# Patient Record
Sex: Male | Born: 1970 | Race: White | Hispanic: No | Smoking: Never smoker
Health system: Southern US, Community
[De-identification: ages and names within clinical notes are randomized; demographics above are authoritative.]

## PROBLEM LIST (undated history)

## (undated) DIAGNOSIS — L0591 Pilonidal cyst without abscess: Secondary | ICD-10-CM

## (undated) DIAGNOSIS — F419 Anxiety disorder, unspecified: Secondary | ICD-10-CM

## (undated) DIAGNOSIS — G709 Myoneural disorder, unspecified: Secondary | ICD-10-CM

## (undated) DIAGNOSIS — M5430 Sciatica, unspecified side: Secondary | ICD-10-CM

## (undated) DIAGNOSIS — K219 Gastro-esophageal reflux disease without esophagitis: Secondary | ICD-10-CM

## (undated) DIAGNOSIS — I10 Essential (primary) hypertension: Secondary | ICD-10-CM

## (undated) HISTORY — PX: WISDOM TOOTH EXTRACTION: SHX21

## (undated) HISTORY — DX: Myoneural disorder, unspecified: G70.9

## (undated) HISTORY — DX: Gastro-esophageal reflux disease without esophagitis: K21.9

---

## 2002-04-24 DIAGNOSIS — I10 Essential (primary) hypertension: Secondary | ICD-10-CM

## 2002-04-24 HISTORY — DX: Essential (primary) hypertension: I10

## 2004-04-24 DIAGNOSIS — K219 Gastro-esophageal reflux disease without esophagitis: Secondary | ICD-10-CM

## 2004-04-24 HISTORY — DX: Gastro-esophageal reflux disease without esophagitis: K21.9

## 2013-06-24 ENCOUNTER — Ambulatory Visit: Payer: Self-pay | Admitting: Emergency Medicine

## 2016-11-17 ENCOUNTER — Ambulatory Visit: Payer: Self-pay | Admitting: *Deleted

## 2016-11-17 VITALS — BP 132/80 | HR 73 | Ht 73.0 in | Wt 268.0 lb

## 2016-11-17 DIAGNOSIS — Z0000 Encounter for general adult medical examination without abnormal findings: Secondary | ICD-10-CM

## 2016-11-17 NOTE — Progress Notes (Signed)
[employer] insurance premium discount evaluation: Labs Drawn. Replacements ROI form signed. Tobacco Free Attestation form signed.  Forms placed in paper chart.  Ok to route results to pcp per pt. 

## 2016-11-18 LAB — CMP12+LP+TP+TSH+6AC+PSA+CBC…
A/G RATIO: 1.6 (ref 1.2–2.2)
ALK PHOS: 62 IU/L (ref 39–117)
ALT: 20 IU/L (ref 0–44)
AST: 16 IU/L (ref 0–40)
Albumin: 4.6 g/dL (ref 3.5–5.5)
BASOS: 1 %
BILIRUBIN TOTAL: 0.6 mg/dL (ref 0.0–1.2)
BUN/Creatinine Ratio: 16 (ref 9–20)
BUN: 16 mg/dL (ref 6–24)
Basophils Absolute: 0 10*3/uL (ref 0.0–0.2)
CHLORIDE: 104 mmol/L (ref 96–106)
CHOLESTEROL TOTAL: 158 mg/dL (ref 100–199)
Calcium: 9.7 mg/dL (ref 8.7–10.2)
Chol/HDL Ratio: 5.3 ratio — ABNORMAL HIGH (ref 0.0–5.0)
Creatinine, Ser: 0.99 mg/dL (ref 0.76–1.27)
EOS (ABSOLUTE): 0.2 10*3/uL (ref 0.0–0.4)
Eos: 3 %
Estimated CHD Risk: 1.1 times avg. — ABNORMAL HIGH (ref 0.0–1.0)
FREE THYROXINE INDEX: 2.2 (ref 1.2–4.9)
GFR calc Af Amer: 106 mL/min/{1.73_m2} (ref >59)
GFR calc non Af Amer: 92 mL/min/{1.73_m2} (ref >59)
GGT: 23 IU/L (ref 0–65)
GLUCOSE: 90 mg/dL (ref 65–99)
Globulin, Total: 2.9 g/dL (ref 1.5–4.5)
HDL: 30 mg/dL — ABNORMAL LOW (ref >39)
HEMATOCRIT: 43.7 % (ref 37.5–51.0)
Hemoglobin: 14.9 g/dL (ref 13.0–17.7)
IMMATURE GRANULOCYTES: 0 %
IRON: 116 ug/dL (ref 38–169)
Immature Grans (Abs): 0 10*3/uL (ref 0.0–0.1)
LDH: 146 IU/L (ref 121–224)
LDL Calculated: 104 mg/dL — ABNORMAL HIGH (ref 0–99)
LYMPHS ABS: 2.3 10*3/uL (ref 0.7–3.1)
Lymphs: 40 %
MCH: 29.2 pg (ref 26.6–33.0)
MCHC: 34.1 g/dL (ref 31.5–35.7)
MCV: 86 fL (ref 79–97)
MONOS ABS: 0.3 10*3/uL (ref 0.1–0.9)
Monocytes: 6 %
NEUTROS PCT: 50 %
Neutrophils Absolute: 3 10*3/uL (ref 1.4–7.0)
PLATELETS: 223 10*3/uL (ref 150–379)
PROSTATE SPECIFIC AG, SERUM: 1 ng/mL (ref 0.0–4.0)
Phosphorus: 3.2 mg/dL (ref 2.5–4.5)
Potassium: 5 mmol/L (ref 3.5–5.2)
RBC: 5.11 x10E6/uL (ref 4.14–5.80)
RDW: 13.4 % (ref 12.3–15.4)
SODIUM: 140 mmol/L (ref 134–144)
T3 UPTAKE RATIO: 27 % (ref 24–39)
T4 TOTAL: 8.2 ug/dL (ref 4.5–12.0)
TOTAL PROTEIN: 7.5 g/dL (ref 6.0–8.5)
TRIGLYCERIDES: 122 mg/dL (ref 0–149)
TSH: 1.25 u[IU]/mL (ref 0.450–4.500)
URIC ACID: 5.7 mg/dL (ref 3.7–8.6)
VLDL Cholesterol Cal: 24 mg/dL (ref 5–40)
WBC: 5.9 10*3/uL (ref 3.4–10.8)

## 2016-11-18 LAB — HGB A1C W/O EAG: Hgb A1c MFr Bld: 5.3 % (ref 4.8–5.6)

## 2016-11-21 NOTE — Progress Notes (Signed)
Results reviewed with pt. HDL low, LDL slightly elevated, chronic for pt. He sts he has tried almonds daily, glass of red wine, exercise, etc. With no improvement for years. PCP has been watching and does not recommend medication and this time, just continued observation and low-chol, heart healthy diet. All other labs unremarkable. Copy provided to pt. Results routed to pcp per pt request.

## 2018-04-16 ENCOUNTER — Encounter: Payer: Self-pay | Admitting: Emergency Medicine

## 2018-04-16 ENCOUNTER — Other Ambulatory Visit: Payer: Self-pay

## 2018-04-16 ENCOUNTER — Emergency Department: Payer: 59

## 2018-04-16 ENCOUNTER — Emergency Department
Admission: EM | Admit: 2018-04-16 | Discharge: 2018-04-16 | Disposition: A | Discharge status: Home / Self Care | Payer: 59 | Attending: Emergency Medicine | Admitting: Emergency Medicine

## 2018-04-16 DIAGNOSIS — R079 Chest pain, unspecified: Secondary | ICD-10-CM

## 2018-04-16 DIAGNOSIS — Z79899 Other long term (current) drug therapy: Secondary | ICD-10-CM | POA: Insufficient documentation

## 2018-04-16 DIAGNOSIS — I10 Essential (primary) hypertension: Secondary | ICD-10-CM | POA: Insufficient documentation

## 2018-04-16 DIAGNOSIS — R519 Headache, unspecified: Secondary | ICD-10-CM

## 2018-04-16 DIAGNOSIS — R51 Headache: Secondary | ICD-10-CM | POA: Insufficient documentation

## 2018-04-16 DIAGNOSIS — R0789 Other chest pain: Secondary | ICD-10-CM | POA: Diagnosis present

## 2018-04-16 HISTORY — DX: Essential (primary) hypertension: I10

## 2018-04-16 LAB — BASIC METABOLIC PANEL
Anion gap: 12 (ref 5–15)
BUN: 15 mg/dL (ref 6–20)
CHLORIDE: 104 mmol/L (ref 98–111)
CO2: 22 mmol/L (ref 22–32)
Calcium: 9.1 mg/dL (ref 8.9–10.3)
Creatinine, Ser: 1.03 mg/dL (ref 0.61–1.24)
GFR calc Af Amer: >60 mL/min (ref >60)
GFR calc non Af Amer: >60 mL/min (ref >60)
Glucose, Bld: 99 mg/dL (ref 70–99)
POTASSIUM: 3.4 mmol/L — AB (ref 3.5–5.1)
SODIUM: 138 mmol/L (ref 135–145)

## 2018-04-16 LAB — CBC
HCT: 44.1 % (ref 39.0–52.0)
Hemoglobin: 15 g/dL (ref 13.0–17.0)
MCH: 29.5 pg (ref 26.0–34.0)
MCHC: 34 g/dL (ref 30.0–36.0)
MCV: 86.8 fL (ref 80.0–100.0)
Platelets: 260 10*3/uL (ref 150–400)
RBC: 5.08 MIL/uL (ref 4.22–5.81)
RDW: 12.3 % (ref 11.5–15.5)
WBC: 9.9 10*3/uL (ref 4.0–10.5)
nRBC: 0 % (ref 0.0–0.2)

## 2018-04-16 LAB — TROPONIN I: Troponin I: <0.03 ng/mL (ref <0.03)

## 2018-04-16 MED ORDER — DIPHENHYDRAMINE HCL 25 MG PO CAPS: 50.0000 mg | ORAL_CAPSULE | Freq: Four times a day (QID) | ORAL | 0 refills | Status: DC | PRN

## 2018-04-16 MED ORDER — KETOROLAC TROMETHAMINE 30 MG/ML IJ SOLN
15.0000 mg | INTRAMUSCULAR | Status: AC
  Administered 2018-04-16: 15 mg via INTRAVENOUS
  Filled 2018-04-16: qty 1

## 2018-04-16 MED ORDER — METOCLOPRAMIDE HCL 10 MG PO TABS: 10.0000 mg | ORAL_TABLET | Freq: Four times a day (QID) | ORAL | 0 refills | Status: DC | PRN

## 2018-04-16 MED ORDER — IOPAMIDOL (ISOVUE-370) INJECTION 76%
75.0000 mL | Freq: Once | INTRAVENOUS | Status: AC | PRN
  Administered 2018-04-16: 75 mL via INTRAVENOUS

## 2018-04-16 MED ORDER — METOCLOPRAMIDE HCL 5 MG/ML IJ SOLN
10.0000 mg | Freq: Once | INTRAMUSCULAR | Status: AC
  Administered 2018-04-16: 10 mg via INTRAVENOUS
  Filled 2018-04-16: qty 2

## 2018-04-16 MED ORDER — KETOROLAC TROMETHAMINE 10 MG PO TABS: 10.0000 mg | ORAL_TABLET | Freq: Four times a day (QID) | ORAL | 0 refills | Status: DC | PRN

## 2018-04-16 MED ORDER — DIPHENHYDRAMINE HCL 50 MG/ML IJ SOLN
25.0000 mg | Freq: Once | INTRAMUSCULAR | Status: AC
  Administered 2018-04-16: 25 mg via INTRAVENOUS
  Filled 2018-04-16: qty 1

## 2018-04-16 MED ORDER — SODIUM CHLORIDE 0.9 % IV BOLUS
1000.0000 mL | Freq: Once | INTRAVENOUS | Status: AC
  Administered 2018-04-16: 1000 mL via INTRAVENOUS

## 2018-04-16 NOTE — ED Provider Notes (Signed)
[facility] Emergency Department Provider Note  ____________________________________________  Time seen: Approximately 11:30 PM  I have reviewed the triage vital signs and the nursing notes.   HISTORY  Chief Complaint Chest Pain    HPI Kurt Williamson is a 47 y.o. male with a history of hypertension who comes to the ED complaining of a abrupt onset left-sided headache, temporal/retro-orbital, started 2 days ago, constant, waxing and waning, no aggravating or alleviating factors, severe.  Headache feels associated with left-sided neck pain as well.  Today around 6:00 PM he also developed gradual onset of central chest tightness, without aggravating or alleviating factors, not exertional, not pleuritic, no vomiting diaphoresis or shortness of breath.   It was briefly radiating into his left arm but that has resolved.  No palpitations or syncope.     Past Medical History:  Diagnosis Date  . Hypertension      There are no active problems to display for this patient.    History reviewed. No pertinent surgical history.   Prior to Admission medications   Medication Sig Start Date End Date Taking? Authorizing Provider  diphenhydrAMINE (BENADRYL) 25 mg capsule Take 2 capsules (50 mg total) by mouth every 6 (six) hours as needed. 04/16/18   Sharman CheekStafford, Carsten Carstarphen, MD  ketorolac (TORADOL) 10 MG tablet Take 1 tablet (10 mg total) by mouth every 6 (six) hours as needed for moderate pain. 04/16/18   Sharman CheekStafford, Romesha Scherer, MD  lisinopril (PRINIVIL,ZESTRIL) 40 MG tablet Take 40 mg by mouth daily.    [provider]  metoCLOPramide (REGLAN) 10 MG tablet Take 1 tablet (10 mg total) by mouth every 6 (six) hours as needed. 04/16/18   Sharman CheekStafford, Hien Cunliffe, MD     Allergies Patient has no known allergies.   No family history on file.  Social History Social History   Tobacco Use  . Smoking status: Never Smoker  . Smokeless tobacco: Never Used  Substance Use  Topics  . Alcohol use: Yes  . Drug use: Never    Review of Systems  Constitutional:   No fever or chills.  ENT:   No sore throat. No rhinorrhea. Cardiovascular: Positive as above chest pain without syncope. Respiratory:   No dyspnea or cough. Gastrointestinal:   Negative for abdominal pain, vomiting and diarrhea.  Musculoskeletal: Left neck pain as above. All other systems reviewed and are negative except as documented above in ROS and HPI.  ____________________________________________   PHYSICAL EXAM:  VITAL SIGNS: ED Triage Vitals  Enc Vitals Group     BP 04/16/18 1844 (!) 153/95     Pulse Rate 04/16/18 1844 97     Resp 04/16/18 1844 20     Temp 04/16/18 1844 98 F (36.7 C)     Temp Source 04/16/18 1844 Oral     SpO2 04/16/18 1844 99 %     Weight 04/16/18 1840 250 lb (113.4 kg)     Height 04/16/18 1840 6\' 2"  (1.88 m)     Head Circumference --      Peak Flow --      Pain Score 04/16/18 1840 5     Pain Loc --      Pain Edu? --      Excl. in GC? --     Vital signs reviewed, nursing assessments reviewed.   Constitutional:   Alert and oriented. Non-toxic appearance. Eyes:   Conjunctivae are normal. EOMI. PERRL. ENT      Head:   Normocephalic and atraumatic.  Nose:   No congestion/rhinnorhea.       Mouth/Throat:   MMM, no pharyngeal erythema. No peritonsillar mass.       Neck:   No meningismus. Full ROM. Hematological/Lymphatic/Immunilogical:   No cervical lymphadenopathy. Cardiovascular:   RRR. Symmetric bilateral radial and DP pulses.  No murmurs. Cap refill less than 2 seconds. Respiratory:   Normal respiratory effort without tachypnea/retractions. Breath sounds are clear and equal bilaterally. No wheezes/rales/rhonchi. Gastrointestinal:   Soft and nontender. Non distended. There is no CVA tenderness.  No rebound, rigidity, or guarding.  Musculoskeletal:   Normal range of motion in all extremities. No joint effusions.  No lower extremity tenderness.  No  edema.  No muscular tenderness in the neck or back.  No midline spinal tenderness. Neurologic:   Normal speech and language.  Motor grossly intact. No acute focal neurologic deficits are appreciated.  Skin:    Skin is warm, dry and intact. No rash noted.  No petechiae, purpura, or bullae.  ____________________________________________    LABS (pertinent positives/negatives) (all labs ordered are listed, but only abnormal results are displayed) Labs Reviewed  BASIC METABOLIC PANEL - Abnormal; Notable for the following components:      Result Value   Potassium 3.4 (*)    All other components within normal limits  CBC  TROPONIN I  TROPONIN I   ____________________________________________   EKG  Interpreted by me  Date: 04/16/2018  Rate: 84  Rhythm: normal sinus rhythm  QRS Axis: normal  Intervals: normal  ST/T Wave abnormalities: normal  Conduction Disutrbances: none  Narrative Interpretation: unremarkable      ____________________________________________    RADIOLOGY  Ct Angio Head W Or Wo Contrast  Result Date: 04/16/2018 CLINICAL DATA:  Initial evaluation for acute headache, dizziness. EXAM: CT ANGIOGRAPHY HEAD AND NECK TECHNIQUE: Multidetector CT imaging of the head and neck was performed using the standard protocol during bolus administration of intravenous contrast. Multiplanar CT image reconstructions and MIPs were obtained to evaluate the vascular anatomy. Carotid stenosis measurements (when applicable) are obtained utilizing NASCET criteria, using the distal internal carotid diameter as the denominator. CONTRAST:  75mL ISOVUE-370 IOPAMIDOL (ISOVUE-370) INJECTION 76% COMPARISON:  None. FINDINGS: CT HEAD FINDINGS Brain: Cerebral volume within normal limits for patient age. No evidence for acute intracranial hemorrhage. No findings to suggest acute large vessel territory infarct. No mass lesion, midline shift, or mass effect. Ventricles are normal in size without  evidence for hydrocephalus. No extra-axial fluid collection identified. Vascular: No hyperdense vessel identified. Skull: Scalp soft tissues demonstrate no acute abnormality. Calvarium intact. Sinuses/Orbits: Globes and orbital soft tissues within normal limits. Mild scattered mucosal thickening within the ethmoidal air cells. Right maxillary sinus retention cyst. Paranasal sinuses are otherwise clear. No significant mastoid effusion. CTA NECK FINDINGS Aortic arch: Visualized aortic arch of normal caliber with normal branch pattern. No flow-limiting stenosis about the origin of the great vessels. Visualized subclavian arteries widely patent. Right carotid system: Right common and internal carotid arteries widely patent without stenosis, dissection, or occlusion. Left carotid system: Left common and internal carotid arteries widely patent without stenosis, dissection, or occlusion. Vertebral arteries: Both vertebral arteries arise from the subclavian arteries. Left vertebral artery slightly dominant. Vertebral arteries widely patent within the neck without stenosis, dissection, or occlusion. Skeleton: No acute osseous abnormality. No discrete lytic or blastic osseous lesions. Other neck: No other acute soft tissue abnormality within the neck. Upper chest: Visualized upper chest demonstrates no acute finding. Mild scattered subsegmental atelectatic changes noted within the  visualized lungs. Review of the MIP images confirms the above findings CTA HEAD FINDINGS Anterior circulation: Internal carotid arteries widely patent to the termini without stenosis. A1 segments, anterior communicating artery common anterior cerebral arteries widely patent without abnormality. M1 segments widely patent without stenosis or occlusion. Normal MCA bifurcations. Distal MCA branches well perfused and symmetric. Posterior circulation: Vertebral arteries widely patent to the vertebrobasilar junction without stenosis. Posterior inferior  cerebral arteries patent bilaterally. Basilar widely patent to its distal aspect without stenosis. Superior cerebellar and posterior cerebral arteries widely patent bilaterally without abnormality. Venous sinuses: Patent. Anatomic variants: None significant. No intracranial aneurysm or other vascular abnormality. Delayed phase: No abnormal enhancement. Review of the MIP images confirms the above findings IMPRESSION: Normal CTA of the head and neck. No acute intracranial abnormality identified. Electronically Signed   By: Rise MuBenjamin  McClintock M.D.   On: 04/16/2018 21:37   Dg Chest 2 View  Result Date: 04/16/2018 CLINICAL DATA:  Chest pain radiating to left arm.  Dizziness. EXAM: CHEST - 2 VIEW COMPARISON:  None. FINDINGS: The heart size and mediastinal contours are within normal limits. Both lungs are clear. The visualized skeletal structures are unremarkable. IMPRESSION: No active cardiopulmonary disease. Electronically Signed   By: Myles RosenthalJohn  Stahl M.D.   On: 04/16/2018 19:08   Ct Angio Neck W And/or Wo Contrast  Result Date: 04/16/2018 CLINICAL DATA:  Initial evaluation for acute headache, dizziness. EXAM: CT ANGIOGRAPHY HEAD AND NECK TECHNIQUE: Multidetector CT imaging of the head and neck was performed using the standard protocol during bolus administration of intravenous contrast. Multiplanar CT image reconstructions and MIPs were obtained to evaluate the vascular anatomy. Carotid stenosis measurements (when applicable) are obtained utilizing NASCET criteria, using the distal internal carotid diameter as the denominator. CONTRAST:  75mL ISOVUE-370 IOPAMIDOL (ISOVUE-370) INJECTION 76% COMPARISON:  None. FINDINGS: CT HEAD FINDINGS Brain: Cerebral volume within normal limits for patient age. No evidence for acute intracranial hemorrhage. No findings to suggest acute large vessel territory infarct. No mass lesion, midline shift, or mass effect. Ventricles are normal in size without evidence for hydrocephalus.  No extra-axial fluid collection identified. Vascular: No hyperdense vessel identified. Skull: Scalp soft tissues demonstrate no acute abnormality. Calvarium intact. Sinuses/Orbits: Globes and orbital soft tissues within normal limits. Mild scattered mucosal thickening within the ethmoidal air cells. Right maxillary sinus retention cyst. Paranasal sinuses are otherwise clear. No significant mastoid effusion. CTA NECK FINDINGS Aortic arch: Visualized aortic arch of normal caliber with normal branch pattern. No flow-limiting stenosis about the origin of the great vessels. Visualized subclavian arteries widely patent. Right carotid system: Right common and internal carotid arteries widely patent without stenosis, dissection, or occlusion. Left carotid system: Left common and internal carotid arteries widely patent without stenosis, dissection, or occlusion. Vertebral arteries: Both vertebral arteries arise from the subclavian arteries. Left vertebral artery slightly dominant. Vertebral arteries widely patent within the neck without stenosis, dissection, or occlusion. Skeleton: No acute osseous abnormality. No discrete lytic or blastic osseous lesions. Other neck: No other acute soft tissue abnormality within the neck. Upper chest: Visualized upper chest demonstrates no acute finding. Mild scattered subsegmental atelectatic changes noted within the visualized lungs. Review of the MIP images confirms the above findings CTA HEAD FINDINGS Anterior circulation: Internal carotid arteries widely patent to the termini without stenosis. A1 segments, anterior communicating artery common anterior cerebral arteries widely patent without abnormality. M1 segments widely patent without stenosis or occlusion. Normal MCA bifurcations. Distal MCA branches well perfused and symmetric. Posterior circulation: Vertebral arteries  widely patent to the vertebrobasilar junction without stenosis. Posterior inferior cerebral arteries patent  bilaterally. Basilar widely patent to its distal aspect without stenosis. Superior cerebellar and posterior cerebral arteries widely patent bilaterally without abnormality. Venous sinuses: Patent. Anatomic variants: None significant. No intracranial aneurysm or other vascular abnormality. Delayed phase: No abnormal enhancement. Review of the MIP images confirms the above findings IMPRESSION: Normal CTA of the head and neck. No acute intracranial abnormality identified. Electronically Signed   By: Rise Mu M.D.   On: 04/16/2018 21:37    ____________________________________________   PROCEDURES Procedures  ____________________________________________  DIFFERENTIAL DIAGNOSIS   Cerebral aneurysm, carotid/vertebral dissection, non-STEMI  CLINICAL IMPRESSION / ASSESSMENT AND PLAN / ED COURSE  Pertinent labs & imaging results that were available during my care of the patient were reviewed by me and considered in my medical decision making (see chart for details).    Patient presents with abrupt severe left-sided headache and neck pain concerning for vascular disease.  CT angiogram of the head and neck were obtained which were normal.  After a migraine cocktail he feels much better and I suspect this was a migraine and will continue similar medications to control the symptoms over the next few days.  His chest pain is nonspecific and noncardiac.  Initial troponin was obtained via triage protocol, not clearly indicated.  I did obtain a second troponin to provide a delta since he does have a history of hypertension.  Heart score is low risk.  Repeat troponin negative.  Vital signs remain essentially normal.  Plan to follow-up with his primary care doctor within the next week for continued monitoring of symptoms.  At this point considering the patient's symptoms, medical history, and physical examination today, I have low suspicion for ACS, PE, TAD, pneumothorax, carditis, mediastinitis,  pneumonia, CHF, or sepsis. I also have low suspicion for ischemic stroke, intracranial hemorrhage, meningitis, encephalitis, carotid or vertebral dissection, venous sinus thrombosis, MS, intracranial hypertension, glaucoma, CRAO, CRVO, or temporal arteritis.         ____________________________________________   FINAL CLINICAL IMPRESSION(S) / ED DIAGNOSES    Final diagnoses:  Severe headache  Nonspecific chest pain     ED Discharge Orders         Ordered    ketorolac (TORADOL) 10 MG tablet  Every 6 hours PRN     04/16/18 2329    metoCLOPramide (REGLAN) 10 MG tablet  Every 6 hours PRN     04/16/18 2329    diphenhydrAMINE (BENADRYL) 25 mg capsule  Every 6 hours PRN     04/16/18 2329          Portions of this note were generated with dragon dictation software. Dictation errors may occur despite best attempts at proofreading.   Sharman Cheek, MD 04/16/18 [id number]

## 2018-04-16 NOTE — ED Notes (Signed)
Pt denies any SOB at this time, but states that when chest pressure gets worse, he does have SOB associated with the chest pain. Pt states he had what felt like a migraine a couple days ago. Pt states he then began having chest pressure and numbness in left arm. Pt states he is able to sleep entirely through the night, but when he awakes headache starts and then proceeds into chest pain. Pt states pain became so bad today that he decided to come in. Pt c/o 4/10 chest pressure in central chest at this time. Pt denies any SOB, but appears anxious in bed. Pt Significant other at bedside currently. MD notified, this Rn will continue to monitor.

## 2018-04-16 NOTE — ED Notes (Signed)
Patient transported to CT 

## 2018-04-16 NOTE — Discharge Instructions (Signed)
Your CT scan of the head and neck did not show any acute issues.  Your labs, chest x-ray, and EKG were all okay.  Please follow-up with your doctor for continued monitoring of your symptoms.

## 2018-04-16 NOTE — ED Triage Notes (Signed)
Pt arrived with complaints of mid sternum chest tightness that radiates down his left arm/hand. Pt states the pain also makes him dizzy. Pt reports a significant headache that started Sunday. No neuro deficits in triage.

## 2019-02-21 ENCOUNTER — Encounter: Payer: Self-pay | Admitting: Emergency Medicine

## 2019-02-21 ENCOUNTER — Other Ambulatory Visit: Payer: Self-pay

## 2019-02-21 ENCOUNTER — Emergency Department
Admission: EM | Admit: 2019-02-21 | Discharge: 2019-02-21 | Disposition: A | Discharge status: Home / Self Care | Payer: 59 | Attending: Emergency Medicine | Admitting: Emergency Medicine

## 2019-02-21 ENCOUNTER — Emergency Department: Payer: 59

## 2019-02-21 DIAGNOSIS — Z79899 Other long term (current) drug therapy: Secondary | ICD-10-CM | POA: Insufficient documentation

## 2019-02-21 DIAGNOSIS — N200 Calculus of kidney: Secondary | ICD-10-CM | POA: Insufficient documentation

## 2019-02-21 DIAGNOSIS — I10 Essential (primary) hypertension: Secondary | ICD-10-CM | POA: Diagnosis not present

## 2019-02-21 DIAGNOSIS — R1031 Right lower quadrant pain: Secondary | ICD-10-CM | POA: Diagnosis present

## 2019-02-21 LAB — COMPREHENSIVE METABOLIC PANEL
ALT: 24 U/L (ref 0–44)
AST: 22 U/L (ref 15–41)
Albumin: 4.8 g/dL (ref 3.5–5.0)
Alkaline Phosphatase: 52 U/L (ref 38–126)
Anion gap: 9 (ref 5–15)
BUN: 20 mg/dL (ref 6–20)
CO2: 22 mmol/L (ref 22–32)
Calcium: 9.7 mg/dL (ref 8.9–10.3)
Chloride: 110 mmol/L (ref 98–111)
Creatinine, Ser: 1.2 mg/dL (ref 0.61–1.24)
GFR calc Af Amer: >60 mL/min (ref >60)
GFR calc non Af Amer: >60 mL/min (ref >60)
Glucose, Bld: 99 mg/dL (ref 70–99)
Potassium: 4.1 mmol/L (ref 3.5–5.1)
Sodium: 141 mmol/L (ref 135–145)
Total Bilirubin: 1.1 mg/dL (ref 0.3–1.2)
Total Protein: 8.3 g/dL — ABNORMAL HIGH (ref 6.5–8.1)

## 2019-02-21 LAB — CBC
HCT: 47.6 % (ref 39.0–52.0)
Hemoglobin: 15.8 g/dL (ref 13.0–17.0)
MCH: 29 pg (ref 26.0–34.0)
MCHC: 33.2 g/dL (ref 30.0–36.0)
MCV: 87.5 fL (ref 80.0–100.0)
Platelets: 235 10*3/uL (ref 150–400)
RBC: 5.44 MIL/uL (ref 4.22–5.81)
RDW: 12.2 % (ref 11.5–15.5)
WBC: 9.7 10*3/uL (ref 4.0–10.5)
nRBC: 0 % (ref 0.0–0.2)

## 2019-02-21 LAB — URINALYSIS, COMPLETE (UACMP) WITH MICROSCOPIC
Bacteria, UA: NONE SEEN
Bilirubin Urine: NEGATIVE
Glucose, UA: NEGATIVE mg/dL
Ketones, ur: NEGATIVE mg/dL
Leukocytes,Ua: NEGATIVE
Nitrite: NEGATIVE
Protein, ur: NEGATIVE mg/dL
RBC / HPF: >50 RBC/hpf — ABNORMAL HIGH (ref 0–5)
Specific Gravity, Urine: 1.025 (ref 1.005–1.030)
pH: 5 (ref 5.0–8.0)

## 2019-02-21 LAB — LIPASE, BLOOD: Lipase: 27 U/L (ref 11–51)

## 2019-02-21 MED ORDER — FENTANYL CITRATE (PF) 100 MCG/2ML IJ SOLN
INTRAMUSCULAR | Status: AC
  Administered 2019-02-21: 50 ug via INTRAVENOUS
  Filled 2019-02-21: qty 2

## 2019-02-21 MED ORDER — FENTANYL CITRATE (PF) 100 MCG/2ML IJ SOLN
50.0000 ug | Freq: Once | INTRAMUSCULAR | Status: AC | PRN
  Administered 2019-02-21: 50 ug via INTRAVENOUS
  Filled 2019-02-21: qty 2

## 2019-02-21 MED ORDER — SODIUM CHLORIDE 0.9 % IV BOLUS
1000.0000 mL | Freq: Once | INTRAVENOUS | Status: AC
  Administered 2019-02-21: 1000 mL via INTRAVENOUS

## 2019-02-21 MED ORDER — ONDANSETRON HCL 4 MG PO TABS: 4.0000 mg | ORAL_TABLET | Freq: Every day | ORAL | 0 refills | Status: AC | PRN

## 2019-02-21 MED ORDER — KETOROLAC TROMETHAMINE 30 MG/ML IJ SOLN
30.0000 mg | Freq: Once | INTRAMUSCULAR | Status: AC
  Administered 2019-02-21: 30 mg via INTRAVENOUS
  Filled 2019-02-21: qty 1

## 2019-02-21 MED ORDER — TAMSULOSIN HCL 0.4 MG PO CAPS: 0.4000 mg | ORAL_CAPSULE | Freq: Every day | ORAL | 0 refills | Status: AC

## 2019-02-21 MED ORDER — FENTANYL CITRATE (PF) 100 MCG/2ML IJ SOLN
50.0000 ug | INTRAMUSCULAR | Status: AC | PRN
  Administered 2019-02-21 (×2): 50 ug via INTRAVENOUS

## 2019-02-21 MED ORDER — OXYCODONE HCL 5 MG PO TABS: 5.0000 mg | ORAL_TABLET | Freq: Three times a day (TID) | ORAL | 0 refills | Status: AC | PRN

## 2019-02-21 MED ORDER — ONDANSETRON HCL 4 MG/2ML IJ SOLN
4.0000 mg | Freq: Once | INTRAMUSCULAR | Status: AC | PRN
  Administered 2019-02-21: 13:00:00 4 mg via INTRAVENOUS

## 2019-02-21 MED ORDER — ONDANSETRON HCL 4 MG/2ML IJ SOLN
INTRAMUSCULAR | Status: AC
  Administered 2019-02-21: 4 mg via INTRAVENOUS
  Filled 2019-02-21: qty 2

## 2019-02-21 NOTE — ED Notes (Signed)
Pt otf for imaging 

## 2019-02-21 NOTE — ED Triage Notes (Signed)
Pt arrives with complaints of sudden onset of RLQ pain that radiates to pt's groin. No tenderness upon palpation. Pain reported as a pressure/sharp/cramp. Pt very uncomfortable in triage.

## 2019-02-21 NOTE — ED Provider Notes (Signed)
[facility] Emergency Department Provider Note  ____________________________________________   First MD Initiated Contact with Patient 02/21/19 1512     (approximate)  I have reviewed the triage vital signs and the nursing notes.   HISTORY  Chief Complaint Abdominal Pain    HPI Kurt Williamson is a 48 y.o. male with hypertension who comes in with right lower quadrant pain that radiates to the groin.  Patient said he had sudden onset of right abdominal pain with pain that radiates into his groin that is been intermittent, severe, better with the Toradol and fentanyl, nothing makes it worse.  Associate with some nausea but no vomiting.  Denies prior kidney stone.  Denies prior abdominal surgeries.  Denies having pain like this previously.  No fevers.  Little bit of urinary discomfort.           Past Medical History:  Diagnosis Date  . Hypertension     There are no active problems to display for this patient.   History reviewed. No pertinent surgical history.  Prior to Admission medications   Medication Sig Start Date End Date Taking? Authorizing Provider  diphenhydrAMINE (BENADRYL) 25 mg capsule Take 2 capsules (50 mg total) by mouth every 6 (six) hours as needed. 04/16/18   Sharman CheekStafford, Phillip, MD  ketorolac (TORADOL) 10 MG tablet Take 1 tablet (10 mg total) by mouth every 6 (six) hours as needed for moderate pain. 04/16/18   Sharman CheekStafford, Phillip, MD  lisinopril (PRINIVIL,ZESTRIL) 40 MG tablet Take 40 mg by mouth daily.    [provider]  metoCLOPramide (REGLAN) 10 MG tablet Take 1 tablet (10 mg total) by mouth every 6 (six) hours as needed. 04/16/18   Sharman CheekStafford, Phillip, MD    Allergies Patient has no known allergies.  No family history on file.  Social History Social History   Tobacco Use  . Smoking status: Never Smoker  . Smokeless tobacco: Never Used  Substance Use Topics  . Alcohol use: Yes  . Drug use: Never      Review  of Systems Constitutional: No fever/chills Eyes: No visual changes. ENT: No sore throat. Cardiovascular: Denies chest pain. Respiratory: Denies shortness of breath. Gastrointestinal: Right abdominal pain.  No nausea, no vomiting.  No diarrhea.  No constipation. Genitourinary: Urinary discomfort. Musculoskeletal: Negative for back pain. Skin: Negative for rash. Neurological: Negative for headaches, focal weakness or numbness. All other ROS negative ____________________________________________   PHYSICAL EXAM:  VITAL SIGNS: ED Triage Vitals  Enc Vitals Group     BP 02/21/19 1314 (!) 180/111     Pulse Rate 02/21/19 1314 (!) 107     Resp 02/21/19 1314 17     Temp 02/21/19 1314 98.7 F (37.1 C)     Temp Source 02/21/19 1314 Oral     SpO2 02/21/19 1314 97 %     Weight 02/21/19 1315 250 lb (113.4 kg)     Height 02/21/19 1315 6\' 2"  (1.88 m)     Head Circumference --      Peak Flow --      Pain Score 02/21/19 1315 10     Pain Loc --      Pain Edu? --      Excl. in GC? --     Constitutional: Alert and oriented. Well appearing and in no acute distress. Eyes: Conjunctivae are normal. EOMI. Head: Atraumatic. Nose: No congestion/rhinnorhea. Mouth/Throat: Mucous membranes are moist.   Neck: No stridor. Trachea Midline. FROM Cardiovascular: Tachycardic, regular rhythm. Grossly normal  heart sounds.  Good peripheral circulation. Respiratory: Normal respiratory effort.  No retractions. Lungs CTAB. Gastrointestinal: Soft and nontender. No distention. No abdominal bruits.  Musculoskeletal: No lower extremity tenderness nor edema.  No joint effusions. Neurologic:  Normal speech and language. No gross focal neurologic deficits are appreciated.  Skin:  Skin is warm, dry and intact. No rash noted. Psychiatric: Mood and affect are normal. Speech and behavior are normal. GU: Palpable testicles without any skin changes.  No discharge from the penile head.  No evidence of hernias.   ____________________________________________   LABS (all labs ordered are listed, but only abnormal results are displayed)  Labs Reviewed  COMPREHENSIVE METABOLIC PANEL - Abnormal; Notable for the following components:      Result Value   Total Protein 8.3 (*)    All other components within normal limits  URINALYSIS, COMPLETE (UACMP) WITH MICROSCOPIC - Abnormal; Notable for the following components:   Color, Urine YELLOW (*)    APPearance HAZY (*)    Hgb urine dipstick LARGE (*)    RBC / HPF >50 (*)    All other components within normal limits  LIPASE, BLOOD  CBC   ____________________________________________   RADIOLOGY Kurt Williamson, personally viewed and evaluated these images (plain radiographs) as part of my medical decision making, as well as reviewing the written report by the radiologist.  ED MD interpretation: Concern for right hydro and hydroureter with tiny right stone  Official radiology report(s): Ct Renal Stone Study  Result Date: 02/21/2019 CLINICAL DATA:  RIGHT lower quadrant pain, sudden onset, pain radiating to groin, no tenderness with palpation, sharp crampy pressure pain, uncomfortable, suspected stone disease EXAM: CT ABDOMEN AND PELVIS WITHOUT CONTRAST TECHNIQUE: Multidetector CT imaging of the abdomen and pelvis was performed following the standard protocol without IV contrast. Sagittal and coronal MPR images reconstructed from axial data set. No oral contrast administered for this indication COMPARISON:  None FINDINGS: Lower chest: Minimal linear subsegmental atelectasis LEFT lower lobe Hepatobiliary: Gallbladder and liver normal appearance Pancreas: Normal appearance Spleen: Normal appearance Adrenals/Urinary Tract: Adrenal glands and LEFT kidney normal appearance. Minimal RIGHT renal enlargement and perinephric edema. Mild RIGHT hydronephrosis and hydroureter secondary to a tiny RIGHT ureterovesical junction calculus, best demonstrated on coronal images.  LEFT ureter decompressed. Remainder of bladder unremarkable. Stomach/Bowel: Normal appendix. Diverticulosis of descending and sigmoid colon without evidence of diverticulitis. Portions of the sigmoid colon are under distended, unable to exclude wall thickening in this setting. Small bowel loops unremarkable. Stomach normal appearance. Vascular/Lymphatic: Aorta normal caliber.  No adenopathy. Reproductive: Prostate gland and seminal vesicles unremarkable Other: BILATERAL inguinal hernias containing fat. Umbilical hernia containing fat. No free air or free fluid. Musculoskeletal: Osseous structures unremarkable. IMPRESSION: RIGHT hydronephrosis and hydroureter secondary to a tiny RIGHT ureterovesical junction calculus best demonstrated on coronal images. Distal colonic diverticulosis without evidence of diverticulitis. Electronically Signed   By: Lavonia Dana M.D.   On: 02/21/2019 15:48    ____________________________________________   PROCEDURES  Procedure(s) performed (including Critical Care):  Procedures   ____________________________________________   INITIAL IMPRESSION / ASSESSMENT AND PLAN / ED COURSE  Kurt Williamson was evaluated in Emergency Department on 02/21/2019 for the symptoms described in the history of present illness. He was evaluated in the context of the global COVID-19 pandemic, which necessitated consideration that the patient might be at risk for infection with the SARS-CoV-2 virus that causes COVID-19. Institutional protocols and algorithms that pertain to the evaluation of patients at risk for COVID-19 are in  a state of rapid change based on information released by regulatory bodies including the CDC and federal and state organizations. These policies and algorithms were followed during the patient's care in the ED.    Patient story is most concerning for kidney stone.  Also consider appendicitis given the location of the pain but patient not actually tender upon  examination and the pain was sudden onset.  Will get urine to evaluate for UTI.  Lower suspicion for pancreatitis or cholecystitis given location of the pain.  No evidence of testicular issues upon examination.   Labs notable for RBC greater than 50.  No evidence of UTI.  No evidence of pancreatitis.  LFTs are normal.  No evidence of anemia.  White count is normal.  CT scan was concerning for right hydronephrosis and hydroureter secondary a tiny right UVJ calculus.  This is consistent with patient's symptoms.  Given the small size of the stone and patient is not having vomiting, fevers and urine without evidence of UTI and pain is well controlled patient will be a good candidate for outpatient treatment with pain meds and nausea meds and follow-up with urology as needed.  4:04 PM reevaluated patient patient feels comfortable with discharge home and will follow with urology as needed.  Patient already has naproxen at home.  But will give some oxycodone to use for breakthrough pain.  Patient understands he cannot drive or work while on this medication.    I discussed the provisional nature of ED diagnosis, the treatment so far, the ongoing plan of care, follow up appointments and return precautions with the patient and any family or support people present. They expressed understanding and agreed with the plan, discharged home.  ____________________________________________   FINAL CLINICAL IMPRESSION(S) / ED DIAGNOSES   Final diagnoses:  Kidney stone      MEDICATIONS GIVEN DURING THIS VISIT:  Medications  ondansetron (ZOFRAN) injection 4 mg (4 mg Intravenous Given 02/21/19 1318)  fentaNYL (SUBLIMAZE) injection 50 mcg (50 mcg Intravenous Given 02/21/19 1402)  ketorolac (TORADOL) 30 MG/ML injection 30 mg (30 mg Intravenous Given 02/21/19 1450)     ED Discharge Orders         Ordered    oxyCODONE (ROXICODONE) 5 MG immediate release tablet  Every 8 hours PRN     02/21/19 1606     ondansetron (ZOFRAN) 4 MG tablet  Daily PRN     02/21/19 1606    tamsulosin (FLOMAX) 0.4 MG CAPS capsule  Daily     02/21/19 1606           Note:  This document was prepared using Dragon voice recognition software and may include unintentional dictation errors.   Concha Se, MD 02/21/19 [phone]

## 2019-02-21 NOTE — Discharge Instructions (Signed)
You have a kidney stone in your right side.  Take the naproxen daily to help with your pain.  Take the oxycodone for breakthrough pain.  Take Zofran for nausea.  Take the tamsulosin to help dilate the urethra.  Return to the ER for fevers, worsening pain, vomiting or any other concerns.   IMPRESSION: RIGHT hydronephrosis and hydroureter secondary to a tiny RIGHT ureterovesical junction calculus best demonstrated on coronal images.   Distal colonic diverticulosis without evidence of diverticulitis.

## 2019-02-21 NOTE — ED Notes (Signed)
Pt speaking with this RN, reports pain 8/10 in RLQ and going down into right testicle. A&Ox4.

## 2019-06-09 ENCOUNTER — Encounter: Payer: Self-pay | Admitting: *Deleted

## 2019-06-09 ENCOUNTER — Other Ambulatory Visit: Payer: Self-pay

## 2019-06-09 ENCOUNTER — Emergency Department: Payer: 59

## 2019-06-09 ENCOUNTER — Emergency Department
Admission: EM | Admit: 2019-06-09 | Discharge: 2019-06-09 | Disposition: A | Discharge status: Home / Self Care | Payer: 59 | Attending: Emergency Medicine | Admitting: Emergency Medicine

## 2019-06-09 DIAGNOSIS — Z5321 Procedure and treatment not carried out due to patient leaving prior to being seen by health care provider: Secondary | ICD-10-CM | POA: Insufficient documentation

## 2019-06-09 DIAGNOSIS — R0789 Other chest pain: Secondary | ICD-10-CM | POA: Insufficient documentation

## 2019-06-09 DIAGNOSIS — F419 Anxiety disorder, unspecified: Secondary | ICD-10-CM | POA: Diagnosis present

## 2019-06-09 DIAGNOSIS — R000 Tachycardia, unspecified: Secondary | ICD-10-CM | POA: Insufficient documentation

## 2019-06-09 LAB — BASIC METABOLIC PANEL
Anion gap: 9 (ref 5–15)
BUN: 18 mg/dL (ref 6–20)
CO2: 23 mmol/L (ref 22–32)
Calcium: 9.4 mg/dL (ref 8.9–10.3)
Chloride: 106 mmol/L (ref 98–111)
Creatinine, Ser: 1.2 mg/dL (ref 0.61–1.24)
GFR calc Af Amer: >60 mL/min (ref >60)
GFR calc non Af Amer: >60 mL/min (ref >60)
Glucose, Bld: 124 mg/dL — ABNORMAL HIGH (ref 70–99)
Potassium: 3.9 mmol/L (ref 3.5–5.1)
Sodium: 138 mmol/L (ref 135–145)

## 2019-06-09 LAB — CBC
HCT: 46.5 % (ref 39.0–52.0)
Hemoglobin: 16 g/dL (ref 13.0–17.0)
MCH: 29.5 pg (ref 26.0–34.0)
MCHC: 34.4 g/dL (ref 30.0–36.0)
MCV: 85.8 fL (ref 80.0–100.0)
Platelets: 284 10*3/uL (ref 150–400)
RBC: 5.42 MIL/uL (ref 4.22–5.81)
RDW: 12.7 % (ref 11.5–15.5)
WBC: 14.3 10*3/uL — ABNORMAL HIGH (ref 4.0–10.5)
nRBC: 0 % (ref 0.0–0.2)

## 2019-06-09 LAB — TROPONIN I (HIGH SENSITIVITY)
Troponin I (High Sensitivity): <2 ng/L (ref <18)
Troponin I (High Sensitivity): 3 ng/L (ref <18)

## 2019-06-09 NOTE — ED Triage Notes (Signed)
PT to ED reporting he thinks he is having an allergic reaction to a new medication, Nifedipine that he took around 16:00 today. Pt appears very anxious in ED and states feeling like his heart is racing, his chest is tight and his throat is tight. No obvious changes in voice or SOB in triage but pt is tachypnic.

## 2019-06-10 ENCOUNTER — Telehealth: Payer: Self-pay | Admitting: Emergency Medicine

## 2019-06-10 NOTE — Telephone Encounter (Signed)
Called patient due to lwot to inquire about condition and follow up plans.  Says he is on way to pcp now.

## 2019-07-11 ENCOUNTER — Other Ambulatory Visit: Payer: Self-pay

## 2019-07-11 ENCOUNTER — Emergency Department: Payer: 59

## 2019-07-11 ENCOUNTER — Emergency Department
Admission: EM | Admit: 2019-07-11 | Discharge: 2019-07-11 | Disposition: A | Discharge status: Home / Self Care | Payer: 59 | Attending: Emergency Medicine | Admitting: Emergency Medicine

## 2019-07-11 DIAGNOSIS — I10 Essential (primary) hypertension: Secondary | ICD-10-CM | POA: Insufficient documentation

## 2019-07-11 DIAGNOSIS — R0789 Other chest pain: Secondary | ICD-10-CM | POA: Insufficient documentation

## 2019-07-11 DIAGNOSIS — Z79899 Other long term (current) drug therapy: Secondary | ICD-10-CM | POA: Insufficient documentation

## 2019-07-11 DIAGNOSIS — R0602 Shortness of breath: Secondary | ICD-10-CM | POA: Insufficient documentation

## 2019-07-11 LAB — CBC
HCT: 45.2 % (ref 39.0–52.0)
Hemoglobin: 15.4 g/dL (ref 13.0–17.0)
MCH: 29.6 pg (ref 26.0–34.0)
MCHC: 34.1 g/dL (ref 30.0–36.0)
MCV: 86.8 fL (ref 80.0–100.0)
Platelets: 244 10*3/uL (ref 150–400)
RBC: 5.21 MIL/uL (ref 4.22–5.81)
RDW: 12.3 % (ref 11.5–15.5)
WBC: 8.3 10*3/uL (ref 4.0–10.5)
nRBC: 0 % (ref 0.0–0.2)

## 2019-07-11 LAB — BASIC METABOLIC PANEL
Anion gap: 8 (ref 5–15)
BUN: 22 mg/dL — ABNORMAL HIGH (ref 6–20)
CO2: 26 mmol/L (ref 22–32)
Calcium: 9.5 mg/dL (ref 8.9–10.3)
Chloride: 101 mmol/L (ref 98–111)
Creatinine, Ser: 1.06 mg/dL (ref 0.61–1.24)
GFR calc Af Amer: >60 mL/min (ref >60)
GFR calc non Af Amer: >60 mL/min (ref >60)
Glucose, Bld: 131 mg/dL — ABNORMAL HIGH (ref 70–99)
Potassium: 3.3 mmol/L — ABNORMAL LOW (ref 3.5–5.1)
Sodium: 135 mmol/L (ref 135–145)

## 2019-07-11 LAB — TROPONIN I (HIGH SENSITIVITY)
Troponin I (High Sensitivity): <2 ng/L (ref <18)
Troponin I (High Sensitivity): <2 ng/L (ref <18)

## 2019-07-11 MED ORDER — SODIUM CHLORIDE 0.9% FLUSH: 3.0000 mL | Freq: Once | INTRAVENOUS | Status: DC

## 2019-07-11 NOTE — ED Provider Notes (Signed)
[facility] Emergency Department Provider Note ____________________________________________   First MD Initiated Contact with Patient 07/11/19 2006     (approximate)  I have reviewed the triage vital signs and the nursing notes.   HISTORY  Chief Complaint Chest Pain    HPI Kurt Williamson is a 49 y.o. male with a history of hypertension who presents with an episode of chest pain, acute onset this afternoon when he was driving, described as a sharp shooting pain side of his chest and radiating to his left arm causing a tingling type sensation.  The patient states that he felt short of breath, lightheaded, and anxious.  He states that the pain started to resolve within a few seconds, and the other symptoms resolved over the next several minutes.  He now just feels a mild tightness in this area but no active pain.  He has no numbness or weakness in the arms.  He denies any leg pain or swelling.  He has not had any prior history of this pain.    The patient states that over the last few months he has been switched around on blood pressure medications by his primary care doctor to find a regimen that works, and he has been having some side effects of feeling a bit weak and fatigued.  However this did not worsen acutely.  Past Medical History:  Diagnosis Date  . Hypertension     There are no problems to display for this patient.   History reviewed. No pertinent surgical history.  Prior to Admission medications   Medication Sig Start Date End Date Taking? Authorizing Provider  diphenhydrAMINE (BENADRYL) 25 mg capsule Take 2 capsules (50 mg total) by mouth every 6 (six) hours as needed. 04/16/18   Sharman Cheek, MD  ketorolac (TORADOL) 10 MG tablet Take 1 tablet (10 mg total) by mouth every 6 (six) hours as needed for moderate pain. 04/16/18   Sharman Cheek, MD  lisinopril (PRINIVIL,ZESTRIL) 40 MG tablet Take 40 mg by mouth daily.    [provider]  metoCLOPramide (REGLAN) 10 MG tablet Take 1 tablet (10 mg total) by mouth every 6 (six) hours as needed. 04/16/18   Sharman Cheek, MD  ondansetron (ZOFRAN) 4 MG tablet Take 1 tablet (4 mg total) by mouth daily as needed for nausea or vomiting. 02/21/19 02/21/20  Concha Se, MD    Allergies Patient has no known allergies.  No family history on file.  Social History Social History   Tobacco Use  . Smoking status: Never Smoker  . Smokeless tobacco: Never Used  Substance Use Topics  . Alcohol use: Yes  . Drug use: Never    Review of Systems  Constitutional: No fever/chills. Eyes: No visual changes. ENT: No sore throat. Cardiovascular: Positive for resolved chest pain. Respiratory: Positive for resolved shortness of breath. Gastrointestinal: No vomiting or diarrhea.  Genitourinary: Negative for flank pain.  Musculoskeletal: Negative for back pain. Skin: Negative for rash. Neurological: Negative for headaches, focal weakness or numbness.   ____________________________________________   PHYSICAL EXAM:  VITAL SIGNS: ED Triage Vitals  Enc Vitals Group     BP 07/11/19 1506 (!) 154/88     Pulse Rate 07/11/19 1506 (!) 113     Resp 07/11/19 1506 18     Temp 07/11/19 1506 97.9 F (36.6 C)     Temp Source 07/11/19 1951 Oral     SpO2 07/11/19 1506 100 %     Weight 07/11/19 1506 265 lb (  120.2 kg)     Height 07/11/19 1506 6' (1.829 m)     Head Circumference --      Peak Flow --      Pain Score 07/11/19 1506 7     Pain Loc --      Pain Edu? --      Excl. in GC? --     Constitutional: Alert and oriented. Well appearing and in no acute distress. Eyes: Conjunctivae are normal.  Head: Atraumatic. Nose: No congestion/rhinnorhea. Mouth/Throat: Mucous membranes are moist.   Neck: Normal range of motion.  Cardiovascular: Normal rate, regular rhythm.  Good peripheral circulation. Respiratory: Normal respiratory effort.  No retractions.  Gastrointestinal:  No  distention.  Musculoskeletal: No lower extremity edema.  No calf or popliteal swelling or tenderness.  Extremities warm and well perfused.  Neurologic:  Normal speech and language.  5/5 motor strength and intact sensation of bilateral upper and lower extremities.  Normal coordination.   Skin:  Skin is warm and dry. No rash noted. Psychiatric: Mood and affect are normal. Speech and behavior are normal.  ____________________________________________   LABS (all labs ordered are listed, but only abnormal results are displayed)  Labs Reviewed  BASIC METABOLIC PANEL - Abnormal; Notable for the following components:      Result Value   Potassium 3.3 (*)    Glucose, Bld 131 (*)    BUN 22 (*)    All other components within normal limits  CBC  TROPONIN I (HIGH SENSITIVITY)  TROPONIN I (HIGH SENSITIVITY)   ____________________________________________  EKG  ED ECG REPORT I, Dionne Bucy, the attending physician, personally viewed and interpreted this ECG.  Date: 07/11/2019 EKG Time: 1511 Rate: 103 Rhythm: Sinus tachycardia QRS Axis: normal Intervals: normal ST/T Wave abnormalities: normal Narrative Interpretation: no evidence of acute ischemia  ____________________________________________  RADIOLOGY  CXR: No focal infiltrate or other acute abnormality  ____________________________________________   PROCEDURES  Procedure(s) performed: No  Procedures  Critical Care performed: No ____________________________________________   INITIAL IMPRESSION / ASSESSMENT AND PLAN / ED COURSE  Pertinent labs & imaging results that were available during my care of the patient were reviewed by me and considered in my medical decision making (see chart for details).  49 year old male with PMH of hypertension presents with atypical and brief left-sided chest pain with some associated tingling and shortness of breath occurring while he was driving.  It started very suddenly, and then  resolved within a few minutes.  Patient denies any pain at this time.  He has no significant associated symptoms.  On exam, he is overall very well-appearing.  His vital signs are normal.  The patient was slightly tachycardic when he arrived, however he appears somewhat anxious and does endorse significant anxiety about this episode whether something could have been going on with his heart.  The physical exam is otherwise unremarkable.  The RN triage note noted that he had numbness to the left arm, but he describes more of a vague tingling.  His neurologic exam is currently normal.  EKG showed borderline tachycardia with no other acute abnormalities.  Overall presentation is consistent with noncardiac etiology of the chest pain, such as muscle spasm, other musculoskeletal cause, or radicular or other benign neurologic pain.  I do not suspect ACS.  Initial and repeat troponin were both negative.  The labs were otherwise unremarkable.  I also do not suspect PE given the lack of hypoxia and the spontaneously resolved pain.  The patient has no VTE risk  factors and no signs or symptoms of DVT.  There is no evidence of aortic dissection or vascular etiology can given the reassuring vital signs and resolved pain.  At this time, based on the resolved symptoms and negative work-up, the patient is stable for discharge home.  I counseled him on the results of the work-up and he appears quite relieved.  He has a follow-up appointment already scheduled for Monday (3 days from now) with his PMD.  Return precautions given, and he expressed understanding. ____________________________________________   FINAL CLINICAL IMPRESSION(S) / ED DIAGNOSES  Final diagnoses:  Atypical chest pain      NEW MEDICATIONS STARTED DURING THIS VISIT:  Discharge Medication List as of 07/11/2019  8:31 PM       Note:  This document was prepared using Dragon voice recognition software and may include unintentional dictation  errors.   Arta Silence, MD 07/11/19 2245

## 2019-07-11 NOTE — ED Triage Notes (Signed)
First RN Note: pt presents to ED via [facility] with c/o CP, per EMS pt left work and was driving home with sudden onset CP that radiates to back and down L arm. Per EMS when patient's point to pain he points to L rib cage. EMS reports pain has been intermittent, pt has been being followed by cardiologist and approx 1 month ago was put on HCTZ.  153/109 120 ST

## 2019-07-11 NOTE — ED Triage Notes (Signed)
Pt comes via [facility] with c/o chest pain that is all over and left arm numbness. Pt states he was driving when this occurred. Pt states he pulled over and called EMS.   Pt states he felt squeezing and SOB when this happened.   Pt states recent new medication that he started about month ago.

## 2019-07-11 NOTE — ED Notes (Signed)
No peripheral IV placed this visit.   Discharge instructions reviewed with patient. Questions fielded by this RN. Patient verbalizes understanding of instructions. Patient discharged home in stable condition per siadecki. No acute distress noted at time of discharge.    

## 2019-07-11 NOTE — ED Notes (Signed)
Pt reports he did not have anything to eat just had some water, reports started to feel CP pain and aching in left arm and hand. Denies any shortness of breath.

## 2019-07-11 NOTE — Discharge Instructions (Addendum)
Follow-up with Dr. Juel Burrow on Monday as scheduled.  Continue to take your blood pressure medication as prescribed.  Return to the ER for new, worsening, or persistent severe chest pain, difficulty breathing, numbness, weakness, or any other new or worsening symptoms that concern you.

## 2019-07-11 NOTE — ED Notes (Signed)
Pt inquiring if he can have some crackers and soft drink reports has not have anything to eat since earlier in the day. Mbr denies any CP, reports mild left shoulder discomfort, denies any other symptom at present. RN encouraged pt if not feeling any nausea to have some crackers.

## 2019-10-14 ENCOUNTER — Other Ambulatory Visit: Payer: Self-pay | Admitting: Internal Medicine

## 2019-10-28 ENCOUNTER — Other Ambulatory Visit: Payer: Self-pay | Admitting: Internal Medicine

## 2019-11-13 ENCOUNTER — Ambulatory Visit: Payer: 59 | Admitting: Internal Medicine

## 2019-11-15 ENCOUNTER — Other Ambulatory Visit: Payer: Self-pay | Admitting: Internal Medicine

## 2020-01-13 ENCOUNTER — Other Ambulatory Visit: Payer: Self-pay | Admitting: Internal Medicine

## 2020-04-07 ENCOUNTER — Other Ambulatory Visit: Payer: Self-pay | Admitting: Internal Medicine

## 2020-06-09 ENCOUNTER — Other Ambulatory Visit: Payer: Self-pay | Admitting: Internal Medicine

## 2020-07-11 ENCOUNTER — Other Ambulatory Visit: Payer: Self-pay | Admitting: Internal Medicine

## 2020-07-25 ENCOUNTER — Other Ambulatory Visit: Payer: Self-pay | Admitting: Internal Medicine

## 2020-11-29 ENCOUNTER — Other Ambulatory Visit: Payer: Self-pay | Admitting: Internal Medicine

## 2020-11-30 ENCOUNTER — Other Ambulatory Visit: Payer: Self-pay | Admitting: *Deleted

## 2020-11-30 ENCOUNTER — Other Ambulatory Visit: Payer: Self-pay | Admitting: Internal Medicine

## 2020-11-30 MED ORDER — TELMISARTAN-HCTZ 80-12.5 MG PO TABS: 1.0000 | ORAL_TABLET | Freq: Every day | ORAL | 0 refills | Status: DC

## 2021-01-03 ENCOUNTER — Other Ambulatory Visit (INDEPENDENT_AMBULATORY_CARE_PROVIDER_SITE_OTHER): Payer: Self-pay

## 2021-01-03 DIAGNOSIS — Z0000 Encounter for general adult medical examination without abnormal findings: Secondary | ICD-10-CM

## 2021-01-04 ENCOUNTER — Other Ambulatory Visit: Payer: Self-pay | Admitting: Internal Medicine

## 2021-01-05 ENCOUNTER — Encounter: Payer: Self-pay | Admitting: Internal Medicine

## 2021-01-05 ENCOUNTER — Other Ambulatory Visit: Payer: Self-pay

## 2021-01-05 ENCOUNTER — Ambulatory Visit (INDEPENDENT_AMBULATORY_CARE_PROVIDER_SITE_OTHER): Payer: No Typology Code available for payment source | Admitting: Internal Medicine

## 2021-01-05 VITALS — BP 132/87 | HR 84 | Ht 72.0 in | Wt 303.4 lb

## 2021-01-05 DIAGNOSIS — R5382 Chronic fatigue, unspecified: Secondary | ICD-10-CM | POA: Diagnosis not present

## 2021-01-05 DIAGNOSIS — Z6841 Body Mass Index (BMI) 40.0 and over, adult: Secondary | ICD-10-CM

## 2021-01-05 DIAGNOSIS — Z0000 Encounter for general adult medical examination without abnormal findings: Secondary | ICD-10-CM | POA: Diagnosis not present

## 2021-01-05 DIAGNOSIS — G9332 Myalgic encephalomyelitis/chronic fatigue syndrome: Secondary | ICD-10-CM

## 2021-01-05 DIAGNOSIS — Z23 Encounter for immunization: Secondary | ICD-10-CM | POA: Diagnosis not present

## 2021-01-05 DIAGNOSIS — E66812 Obesity, class 2: Secondary | ICD-10-CM | POA: Insufficient documentation

## 2021-01-05 DIAGNOSIS — I10 Essential (primary) hypertension: Secondary | ICD-10-CM

## 2021-01-05 DIAGNOSIS — U099 Post covid-19 condition, unspecified: Secondary | ICD-10-CM | POA: Diagnosis not present

## 2021-01-05 NOTE — Assessment & Plan Note (Signed)

## 2021-01-05 NOTE — Assessment & Plan Note (Signed)
Patient has a post-COVID fatigue syndrome, he was advised to lose weight, we will check thyroid function test electrolytes and liver function test on physical examination his heart is regular chest is clear abdomen is soft nontender there is no hepatosplenomegaly there is no pedal edema no calf tenderness.  Patient was educated about the positive approach to life and the hope to find happiness in day-to-day activities

## 2021-01-05 NOTE — Progress Notes (Signed)
Established Patient Office Visit  Subjective:  Patient ID: Kurt Williamson, male    DOB: 05-12-1970  Age: 50 y.o. MRN: [id number]  CC:  Chief Complaint  Patient presents with   Annual Exam    HPI  Kurt Williamson presents for physical  Past Medical History:  Diagnosis Date   Hypertension     History reviewed. No pertinent surgical history.  Family History  Problem Relation Age of Onset   Hypertension Father     Social History   Socioeconomic History   Marital status: Significant Other    Spouse name: Not on file   Number of children: Not on file   Years of education: Not on file   Highest education level: Not on file  Occupational History   Not on file  Tobacco Use   Smoking status: Never   Smokeless tobacco: Never  Substance and Sexual Activity   Alcohol use: Yes   Drug use: Never   Sexual activity: Not Currently  Other Topics Concern   Not on file  Social History Narrative   Not on file   Social Determinants of Health   Financial Resource Strain: Not on file  Food Insecurity: Not on file  Transportation Needs: Not on file  Physical Activity: Not on file  Stress: Not on file  Social Connections: Not on file  Intimate Partner Violence: Not on file     Current Outpatient Medications:    metoprolol succinate (TOPROL-XL) 50 MG 24 hr tablet, TAKE 1 TABLET BY MOUTH DAILY, Disp: 90 tablet, Rfl: 3   PARoxetine (PAXIL) 10 MG tablet, TAKE 1 TABLET BY MOUTH DAILY, Disp: 30 tablet, Rfl: 6   Potassium Chloride ER 20 MEQ TBCR, TAKE 1 TABLET BY MOUTH DAILY, Disp: 30 tablet, Rfl: 6   telmisartan-hydrochlorothiazide (MICARDIS HCT) 80-12.5 MG tablet, TAKE 1 TABLET BY MOUTH DAILY, Disp: 30 tablet, Rfl: 0   No Known Allergies  ROS Review of Systems  Constitutional: Negative.   HENT: Negative.    Eyes: Negative.   Respiratory: Negative.    Cardiovascular: Negative.   Gastrointestinal: Negative.   Endocrine: Negative.   Genitourinary: Negative.    Musculoskeletal: Negative.   Skin: Negative.   Allergic/Immunologic: Negative.   Neurological:  Positive for seizures and speech difficulty. Negative for tremors and numbness.  Hematological: Negative.   Psychiatric/Behavioral: Negative.  Negative for agitation, behavioral problems, confusion, dysphoric mood, self-injury and suicidal ideas.   All other systems reviewed and are negative.    Objective:    Physical Exam Vitals reviewed.  Constitutional:      Appearance: Normal appearance.  HENT:     Mouth/Throat:     Mouth: Mucous membranes are moist.  Eyes:     Pupils: Pupils are equal, round, and reactive to light.  Neck:     Vascular: No carotid bruit.  Cardiovascular:     Rate and Rhythm: Normal rate and regular rhythm.     Pulses: Normal pulses.     Heart sounds: Normal heart sounds.  Pulmonary:     Effort: Pulmonary effort is normal.     Breath sounds: Normal breath sounds.  Abdominal:     General: Bowel sounds are normal.     Palpations: Abdomen is soft. There is no hepatomegaly, splenomegaly or mass.     Tenderness: There is no abdominal tenderness.     Hernia: No hernia is present.  Musculoskeletal:     Cervical back: Neck supple.     Right lower leg: No  edema.     Left lower leg: No edema.  Skin:    Findings: No rash.  Neurological:     Mental Status: He is alert and oriented to person, place, and time.     Motor: No weakness.  Psychiatric:        Mood and Affect: Mood normal.        Behavior: Behavior normal.    BP 132/87   Pulse 84   Ht 6' (1.829 m)   Wt (!) 303 lb 6.4 oz (137.6 kg)   BMI 41.15 kg/m  Wt Readings from Last 3 Encounters:  01/05/21 (!) 303 lb 6.4 oz (137.6 kg)  07/11/19 265 lb (120.2 kg)  06/09/19 250 lb (113.4 kg)     Health Maintenance Due  Topic Date Due   HIV Screening  Never done   Hepatitis C Screening  Never done   TETANUS/TDAP  Never done   COLONOSCOPY (Pts 45-81yr Insurance coverage will need to be confirmed)  Never  done    There are no preventive care reminders to display for this patient.  Lab Results  Component Value Date   TSH 2.16 01/03/2021   Lab Results  Component Value Date   WBC 7.9 01/03/2021   HGB 16.7 01/03/2021   HCT 49.7 01/03/2021   MCV 87.3 01/03/2021   PLT 249 01/03/2021   Lab Results  Component Value Date   NA 139 01/03/2021   K 4.7 01/03/2021   CO2 24 01/03/2021   GLUCOSE 91 01/03/2021   BUN 19 01/03/2021   CREATININE 1.05 01/03/2021   BILITOT 0.7 01/03/2021   ALKPHOS 52 02/21/2019   AST 18 01/03/2021   ALT 30 01/03/2021   PROT 8.4 (H) 01/03/2021   ALBUMIN 4.8 02/21/2019   CALCIUM 10.0 01/03/2021   ANIONGAP 8 07/11/2019   EGFR 87 01/03/2021   Lab Results  Component Value Date   CHOL 158 11/17/2016   Lab Results  Component Value Date   HDL 30 (L) 11/17/2016   Lab Results  Component Value Date   LDLCALC 104 (H) 11/17/2016   Lab Results  Component Value Date   TRIG 122 11/17/2016   Lab Results  Component Value Date   CHOLHDL 5.3 (H) 11/17/2016   Lab Results  Component Value Date   HGBA1C 5.3 11/17/2016      Assessment & Plan:   Problem List Items Addressed This Visit       Cardiovascular and Mediastinum   Primary hypertension     Patient denies any chest pain or shortness of breath there is no history of palpitation or paroxysmal nocturnal dyspnea   patient was advised to follow low-salt low-cholesterol diet    ideally I want to keep systolic blood pressure below 130 mmHg, patient was asked to check blood pressure one times a week and give me a report on that.  Patient will be follow-up in 3 months  or earlier as needed, patient will call me back for any change in the cardiovascular symptoms Patient was advised to buy a book from local bookstore concerning blood pressure and read several chapters  every day.  This will be supplemented by some of the material we will give him from the office.  Patient should also utilize other resources like  YouTube and Internet to learn more about the blood pressure and the diet.        Other   Class 3 severe obesity due to excess calories without serious comorbidity with body mass index (  BMI) of 40.0 to 44.9 in adult [facility])    - I encouraged the patient to lose weight.  - I educated them on making healthy dietary choices including eating more fruits and vegetables and less fried foods. - I encouraged the patient to exercise more, and educated on the benefits of exercise including weight loss, diabetes prevention, and hypertension prevention.   Dietary counseling with a registered dietician  Referral to a weight management support group (e.g. Weight Watchers, Overeaters Anonymous)  If your BMI is greater than 29 or you have gained more than 15 pounds you should work on weight loss.  Attend a healthy cooking class       Post-COVID chronic fatigue - Primary    Patient has a post-COVID fatigue syndrome, he was advised to lose weight, we will check thyroid function test electrolytes and liver function test on physical examination his heart is regular chest is clear abdomen is soft nontender there is no hepatosplenomegaly there is no pedal edema no calf tenderness.  Patient was educated about the positive approach to life and the hope to find happiness in day-to-day activities      Annual physical exam    Patient general physical examination is normal heart is regular chest is clear abdomen is obese soft nontender without any hepatosplenomegaly there is no pedal edema no calf tenderness.  Neurological examination is normal.       No orders of the defined types were placed in this encounter.   Follow-up: No follow-ups on file.    Cletis Athens, MD

## 2021-01-05 NOTE — Assessment & Plan Note (Signed)
Patient general physical examination is normal heart is regular chest is clear abdomen is obese soft nontender without any hepatosplenomegaly there is no pedal edema no calf tenderness.  Neurological examination is normal.

## 2021-01-05 NOTE — Assessment & Plan Note (Signed)

## 2021-01-06 NOTE — Progress Notes (Signed)
Patient was seen for physical, the patient had labs drawn on 01/03/21, the following [facility] were [facility] add on T3, T4, Lipid panel, and CRP per Dr. Fredna Dow request. [facility] states results should be available 24 to 48 hours.

## 2021-01-06 NOTE — Addendum Note (Signed)
Addended by: Garnet Sierras R on: 01/06/2021 01:27 PM   Modules accepted: Orders

## 2021-01-07 LAB — COMPLETE METABOLIC PANEL WITH GFR
AG Ratio: 1.4 (calc) (ref 1.0–2.5)
ALT: 30 U/L (ref 9–46)
AST: 18 U/L (ref 10–40)
Albumin: 4.9 g/dL (ref 3.6–5.1)
Alkaline phosphatase (APISO): 72 U/L (ref 36–130)
BUN: 19 mg/dL (ref 7–25)
CO2: 24 mmol/L (ref 20–32)
Calcium: 10 mg/dL (ref 8.6–10.3)
Chloride: 105 mmol/L (ref 98–110)
Creat: 1.05 mg/dL (ref 0.60–1.29)
Globulin: 3.5 g/dL (calc) (ref 1.9–3.7)
Glucose, Bld: 91 mg/dL (ref 65–99)
Potassium: 4.7 mmol/L (ref 3.5–5.3)
Sodium: 139 mmol/L (ref 135–146)
Total Bilirubin: 0.7 mg/dL (ref 0.2–1.2)
Total Protein: 8.4 g/dL — ABNORMAL HIGH (ref 6.1–8.1)
eGFR: 87 mL/min/{1.73_m2} (ref >=60)

## 2021-01-07 LAB — LIPID PANEL
Cholesterol: 218 mg/dL — ABNORMAL HIGH (ref <200)
HDL: 39 mg/dL — ABNORMAL LOW (ref >=40)
LDL Cholesterol (Calc): 139 mg/dL (calc) — ABNORMAL HIGH
Non-HDL Cholesterol (Calc): 179 mg/dL (calc) — ABNORMAL HIGH (ref <130)
Total CHOL/HDL Ratio: 5.6 (calc) — ABNORMAL HIGH (ref <5.0)
Triglycerides: 247 mg/dL — ABNORMAL HIGH (ref <150)

## 2021-01-07 LAB — CBC WITH DIFFERENTIAL/PLATELET
Absolute Monocytes: 656 cells/uL (ref 200–950)
Basophils Absolute: 47 cells/uL (ref 0–200)
Basophils Relative: 0.6 %
Eosinophils Absolute: 198 cells/uL (ref 15–500)
Eosinophils Relative: 2.5 %
HCT: 49.7 % (ref 38.5–50.0)
Hemoglobin: 16.7 g/dL (ref 13.2–17.1)
Lymphs Abs: 2947 cells/uL (ref 850–3900)
MCH: 29.3 pg (ref 27.0–33.0)
MCHC: 33.6 g/dL (ref 32.0–36.0)
MCV: 87.3 fL (ref 80.0–100.0)
MPV: 10.4 fL (ref 7.5–12.5)
Monocytes Relative: 8.3 %
Neutro Abs: 4053 cells/uL (ref 1500–7800)
Neutrophils Relative %: 51.3 %
Platelets: 249 10*3/uL (ref 140–400)
RBC: 5.69 10*6/uL (ref 4.20–5.80)
RDW: 13.1 % (ref 11.0–15.0)
Total Lymphocyte: 37.3 %
WBC: 7.9 10*3/uL (ref 3.8–10.8)

## 2021-01-07 LAB — TEST AUTHORIZATION

## 2021-01-07 LAB — TSH: TSH: 2.16 mIU/L (ref 0.40–4.50)

## 2021-01-07 LAB — PSA: PSA: 0.6 ng/mL (ref <=4.00)

## 2021-01-07 LAB — T4: T4, Total: 8.3 ug/dL (ref 4.9–10.5)

## 2021-01-07 LAB — C-REACTIVE PROTEIN: CRP: 1.6 mg/L (ref <8.0)

## 2021-01-07 LAB — T3: T3, Total: 137 ng/dL (ref 76–181)

## 2021-01-29 ENCOUNTER — Other Ambulatory Visit: Payer: Self-pay | Admitting: Internal Medicine

## 2021-01-31 ENCOUNTER — Other Ambulatory Visit: Payer: Self-pay | Admitting: Internal Medicine

## 2021-02-02 ENCOUNTER — Ambulatory Visit (INDEPENDENT_AMBULATORY_CARE_PROVIDER_SITE_OTHER): Payer: No Typology Code available for payment source | Admitting: Internal Medicine

## 2021-02-02 ENCOUNTER — Other Ambulatory Visit: Payer: Self-pay

## 2021-02-02 ENCOUNTER — Encounter: Payer: Self-pay | Admitting: Internal Medicine

## 2021-02-02 DIAGNOSIS — E66813 Obesity, class 3: Secondary | ICD-10-CM

## 2021-02-02 DIAGNOSIS — G9332 Myalgic encephalomyelitis/chronic fatigue syndrome: Secondary | ICD-10-CM

## 2021-02-02 DIAGNOSIS — R002 Palpitations: Secondary | ICD-10-CM | POA: Diagnosis not present

## 2021-02-02 DIAGNOSIS — I10 Essential (primary) hypertension: Secondary | ICD-10-CM | POA: Diagnosis not present

## 2021-02-02 DIAGNOSIS — U099 Post covid-19 condition, unspecified: Secondary | ICD-10-CM | POA: Diagnosis not present

## 2021-02-02 DIAGNOSIS — Z6841 Body Mass Index (BMI) 40.0 and over, adult: Secondary | ICD-10-CM | POA: Diagnosis not present

## 2021-02-02 NOTE — Assessment & Plan Note (Addendum)
Patient denies any chest pain or shortness of breath There ishistory of palpitation or paroxysmal nocturnal dyspnea   patient was advised to follow low-salt low-cholesterol diet    ideally I want to keep systolic blood pressure below 803 mmHg, patient was asked to check blood pressure one times a week and give me a report on that.  Patient will be follow-up in 3 months  or earlier as needed, patient will call me back for any change in the cardiovascular symptoms Patient was advised to buy a book from local bookstore concerning blood pressure and read several chapters  every day.  This will be supplemented by some of the material we will give him from the office.  Patient should also utilize other resources like YouTube and Internet to learn more about the blood pressure and the diet.

## 2021-02-02 NOTE — Assessment & Plan Note (Signed)
   Use a measuring cup to measure serving sizes. You could also try weighing out portions on a kitchen scale. With time, you will be able to estimate serving sizes for some foods.  Take time to put servings of different foods on your favorite plates or in your favorite bowls and cups so you know what a serving looks like.  Try not to eat straight from a food's packaging, such as from a bag or box. Eating straight from the package makes it hard to see how much you are eating and can lead to overeating. Put the amount you would like to eat in a cup or on a plate to make sure you are eating the right portion.  Use smaller plates, glasses, and bowls for smaller portions and to prevent overeating.  Try not to multitask. For example, avoid watching TV or using your computer while eating. If it is time to eat, sit down at a table and enjoy your food. This will help you recognize when you are full. It will also help you be more mindful of what and how much you are eating. What are tips for following this plan? Reading food labels  Check the calorie count compared with the serving size. The serving size may be smaller than what you are used to eating.  Check the source of the calories. Try to choose foods that are high in protein, fiber, and vitamins, and low in saturated fat, trans fat, and sodium. Shopping  Read nutrition labels while you shop. This will help you make healthy decisions about which foods to buy.  Pay attention to nutrition labels for low-fat or fat-free foods. These foods sometimes have the same number of calories or more calories than the full-fat versions. They also often have added sugar, starch, or salt to make up for flavor that was removed with the fat.  Make a grocery list of lower-calorie foods and stick to it. Cooking  Try to cook your favorite foods in a healthier way. For example, try baking instead of frying.  Use low-fat dairy products. Meal planning  Use more  fruits and vegetables. One-half of your plate should be fruits and vegetables.  Include lean proteins, such as chicken, turkey, and fish. Lifestyle Each week, aim to do one of the following:  150 minutes of moderate exercise, such as walking.  75 minutes of vigorous exercise, such as running. General information  Know how many calories are in the foods you eat most often. This will help you calculate calorie counts faster.  Find a way of tracking calories that works for you. Get creative. Try different apps or programs if writing down calories does not work for you. 

## 2021-02-02 NOTE — Assessment & Plan Note (Signed)
Getting better on multivitamin according to the patient

## 2021-02-02 NOTE — Progress Notes (Signed)
Established Patient Office Visit  Subjective:  Patient ID: Kurt Williamson, male    DOB: [birth year]  Age: 50 y.o. MRN: [id number]  CC: No chief complaint on file.   HPI  Kurt Williamson presents for echocardiogram.  Patient has a history of palpitation.  Overweight.  Complains of shortness of breath on exertion.  He is known to have hypertension with hypertensive cardiovascular disease. Past Medical History:  Diagnosis Date   Hypertension     History reviewed. No pertinent surgical history.  Family History  Problem Relation Age of Onset   Hypertension Father     Social History   Socioeconomic History   Marital status: Significant Other    Spouse name: Not on file   Number of children: Not on file   Years of education: Not on file   Highest education level: Not on file  Occupational History   Not on file  Tobacco Use   Smoking status: Never   Smokeless tobacco: Never  Substance and Sexual Activity   Alcohol use: Yes   Drug use: Never   Sexual activity: Not Currently  Other Topics Concern   Not on file  Social History Narrative   Not on file   Social Determinants of Health   Financial Resource Strain: Not on file  Food Insecurity: Not on file  Transportation Needs: Not on file  Physical Activity: Not on file  Stress: Not on file  Social Connections: Not on file  Intimate Partner Violence: Not on file     Current Outpatient Medications:    metoprolol succinate (TOPROL-XL) 50 MG 24 hr tablet, TAKE 1 TABLET BY MOUTH DAILY, Disp: 90 tablet, Rfl: 3   PARoxetine (PAXIL) 10 MG tablet, TAKE 1 TABLET BY MOUTH DAILY, Disp: 30 tablet, Rfl: 6   Potassium Chloride ER 20 MEQ TBCR, TAKE 1 TABLET BY MOUTH DAILY, Disp: 30 tablet, Rfl: 6   telmisartan-hydrochlorothiazide (MICARDIS HCT) 80-12.5 MG tablet, TAKE 1 TABLET BY MOUTH DAILY, Disp: 90 tablet, Rfl: 3   No Known Allergies  ROS Review of Systems  Constitutional: Negative.   HENT: Negative.    Eyes: Negative.    Respiratory: Negative.    Cardiovascular: Negative.   Gastrointestinal: Negative.   Endocrine: Negative.   Genitourinary: Negative.   Musculoskeletal: Negative.   Skin: Negative.   Allergic/Immunologic: Negative.   Neurological: Negative.   Hematological: Negative.   Psychiatric/Behavioral: Negative.    All other systems reviewed and are negative.    Objective:    Physical Exam Vitals reviewed.  Constitutional:      Appearance: Normal appearance. He is obese.  HENT:     Head: Atraumatic.     Mouth/Throat:     Mouth: Mucous membranes are moist.  Eyes:     Pupils: Pupils are equal, round, and reactive to light.  Neck:     Vascular: No carotid bruit.  Cardiovascular:     Rate and Rhythm: Regular rhythm. Tachycardia present.     Pulses: Normal pulses.     Heart sounds: Normal heart sounds. No murmur heard. Pulmonary:     Effort: Pulmonary effort is normal.     Breath sounds: Normal breath sounds.  Abdominal:     General: Bowel sounds are normal.     Palpations: Abdomen is soft. There is no hepatomegaly, splenomegaly or mass.     Tenderness: There is no abdominal tenderness.     Hernia: No hernia is present.  Musculoskeletal:     Cervical back: Neck supple.  Right lower leg: No edema.     Left lower leg: No edema.  Skin:    Findings: No rash.  Neurological:     Mental Status: He is alert and oriented to person, place, and time.     Motor: No weakness.  Psychiatric:        Mood and Affect: Mood normal.        Behavior: Behavior normal.    There were no vitals taken for this visit. Wt Readings from Last 3 Encounters:  01/05/21 (!) 303 lb 6.4 oz (137.6 kg)  07/11/19 265 lb (120.2 kg)  06/09/19 250 lb (113.4 kg)     Health Maintenance Due  Topic Date Due   COVID-19 Vaccine (1) Never done   HIV Screening  Never done   Hepatitis C Screening  Never done   COLONOSCOPY (Pts 45-49yr Insurance coverage will need to be confirmed)  Never done    There are no  preventive care reminders to display for this patient.  Lab Results  Component Value Date   TSH 2.16 01/03/2021   Lab Results  Component Value Date   WBC 7.9 01/03/2021   HGB 16.7 01/03/2021   HCT 49.7 01/03/2021   MCV 87.3 01/03/2021   PLT 249 01/03/2021   Lab Results  Component Value Date   NA 139 01/03/2021   K 4.7 01/03/2021   CO2 24 01/03/2021   GLUCOSE 91 01/03/2021   BUN 19 01/03/2021   CREATININE 1.05 01/03/2021   BILITOT 0.7 01/03/2021   ALKPHOS 52 02/21/2019   AST 18 01/03/2021   ALT 30 01/03/2021   PROT 8.4 (H) 01/03/2021   ALBUMIN 4.8 02/21/2019   CALCIUM 10.0 01/03/2021   ANIONGAP 8 07/11/2019   EGFR 87 01/03/2021   Lab Results  Component Value Date   CHOL 218 (H) 01/03/2021   Lab Results  Component Value Date   HDL 39 (L) 01/03/2021   Lab Results  Component Value Date   LDLCALC 139 (H) 01/03/2021   Lab Results  Component Value Date   TRIG 247 (H) 01/03/2021   Lab Results  Component Value Date   CHOLHDL 5.6 (H) 01/03/2021   Lab Results  Component Value Date   HGBA1C 5.3 11/17/2016      Assessment & Plan:   Kurt Williamson: [phone]:  [phone] Transthoracic Echocardiogram Note  [name] Procedure: Transthoracic Echocardiogram Indications: Palpitation Verbal Consent: Obtained  Procedure Details .  Echo is technically difficult because of exogenous obesity.  Revealed normal mitral valve and aortic valve left atrial size is normal right ventricular size and limited views was found to be normal.  Echo is technically limited because of poor acoustic windows   Technical quality: Poor acoustic windows due to obesity  Resting Measurements: Not obtained  Left Ventrical: Appears to be normal  Mitral Valve: Normal  Aortic Valve: Normal  Tricuspid Valve: Difficult to see  Pulmonic Valve: Not seen  Left Atrium/ Left  atrial appendage: Appears to be okay on limited view  Atrial septum:   Aorta: Appears to be normal on limited views   Complications: No apparent complications   JCletis Athens MD       Problem List Items Addressed This Visit       Cardiovascular and Mediastinum   Primary hypertension - Primary     Patient denies any chest pain or shortness of breath There  ishistory of palpitation or paroxysmal nocturnal dyspnea   patient was advised to follow low-salt low-cholesterol diet    ideally I want to keep systolic blood pressure below 130 mmHg, patient was asked to check blood pressure one times a week and give me a report on that.  Patient will be follow-up in 3 months  or earlier as needed, patient will call me back for any change in the cardiovascular symptoms Patient was advised to buy a book from local bookstore concerning blood pressure and read several chapters  every day.  This will be supplemented by some of the material we will give him from the office.  Patient should also utilize other resources like YouTube and Internet to learn more about the blood pressure and the diet.        Other   Class 3 severe obesity due to excess calories without serious comorbidity with body mass index (BMI) of 40.0 to 44.9 in adult Kurt Williamson)        Use a measuring cup to measure serving sizes. You could also try weighing out portions on a kitchen scale. With time, you will be able to estimate serving sizes for some foods.  Take time to put servings of different foods on your favorite plates or in your favorite bowls and cups so you know what a serving looks like.  Try not to eat straight from a food's packaging, such as from a bag or box. Eating straight from the package makes it hard to see how much you are eating and can lead to overeating. Put the amount you would like to eat in a cup or on a plate to make sure you are eating the right portion.  Use smaller plates, glasses, and bowls for smaller  portions and to prevent overeating.  Try not to multitask. For example, avoid watching TV or using your computer while eating. If it is time to eat, sit down at a table and enjoy your food. This will help you recognize when you are full. It will also help you be more mindful of what and how much you are eating. What are tips for following this plan? Reading food labels  Check the calorie count compared with the serving size. The serving size may be smaller than what you are used to eating.  Check the source of the calories. Try to choose foods that are high in protein, fiber, and vitamins, and low in saturated fat, trans fat, and sodium. Shopping  Read nutrition labels while you shop. This will help you make healthy decisions about which foods to buy.  Pay attention to nutrition labels for low-fat or fat-free foods. These foods sometimes have the same number of calories or more calories than the full-fat versions. They also often have added sugar, starch, or salt to make up for flavor that was removed with the fat.  Make a grocery list of lower-calorie foods and stick to it. Cooking  Try to cook your favorite foods in a healthier way. For example, try baking instead of frying.  Use low-fat dairy products. Meal planning  Use more fruits and vegetables. One-half of your plate should be fruits and vegetables.  Include lean proteins, such as chicken, [place], and fish. Lifestyle Each week, aim to do one of the following:  150 minutes of moderate exercise, such as walking.  75 minutes of vigorous exercise, such as running. General information  Know how many calories are in the foods you eat most often. This will  help you calculate calorie counts faster.  Find a way of tracking calories that works for you. Get creative. Try different apps or programs if writing down calories does not work for you.      Post-COVID chronic fatigue    Getting better on multivitamin according to the  patient       No orders of the defined types were placed in this encounter.   Follow-up: No follow-ups on file.   Articular Cletis Athens, MD

## 2021-05-04 ENCOUNTER — Ambulatory Visit: Payer: No Typology Code available for payment source | Admitting: Internal Medicine

## 2021-07-02 ENCOUNTER — Other Ambulatory Visit: Payer: Self-pay | Admitting: Internal Medicine

## 2021-07-07 ENCOUNTER — Other Ambulatory Visit: Payer: Self-pay | Admitting: *Deleted

## 2021-07-07 MED ORDER — TELMISARTAN-HCTZ 80-12.5 MG PO TABS: 1.0000 | ORAL_TABLET | Freq: Every day | ORAL | 1 refills | Status: DC

## 2021-07-07 MED ORDER — METOPROLOL SUCCINATE ER 50 MG PO TB24: 50.0000 mg | ORAL_TABLET | Freq: Every day | ORAL | 1 refills | Status: DC

## 2021-07-08 ENCOUNTER — Other Ambulatory Visit: Payer: Self-pay | Admitting: Internal Medicine

## 2021-07-11 ENCOUNTER — Other Ambulatory Visit: Payer: Self-pay | Admitting: *Deleted

## 2021-07-11 MED ORDER — POTASSIUM CHLORIDE ER 20 MEQ PO TBCR: 1.0000 | EXTENDED_RELEASE_TABLET | Freq: Every day | ORAL | 6 refills | Status: DC

## 2021-09-27 IMAGING — CT CT RENAL STONE PROTOCOL
2 of 4 series · 16 of 46 positions shown, 18 images · non-contrast
Comparison: None

CLINICAL DATA: RIGHT lower quadrant pain, sudden onset, pain
radiating to groin, no tenderness with palpation, sharp crampy
pressure pain, uncomfortable, suspected stone disease

EXAM:
CT ABDOMEN AND PELVIS WITHOUT CONTRAST
TECHNIQUE: Multidetector CT imaging of the abdomen and pelvis was performed
following the standard protocol without IV contrast. Sagittal and
coronal MPR images reconstructed from axial data set. No oral
contrast administered for this indication

[Series 2: stone full standard · axial · 0.85mm/px · z∈[-1476,-951]mm · 13 of 117 slices shown, 15 images]
[im 6/117  soft-tissue]
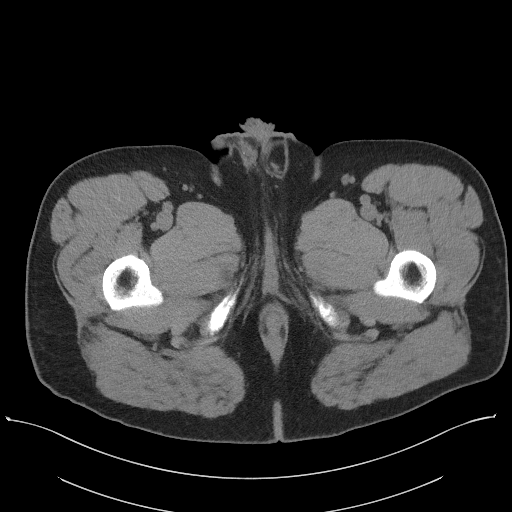
[im 6/117  bone]
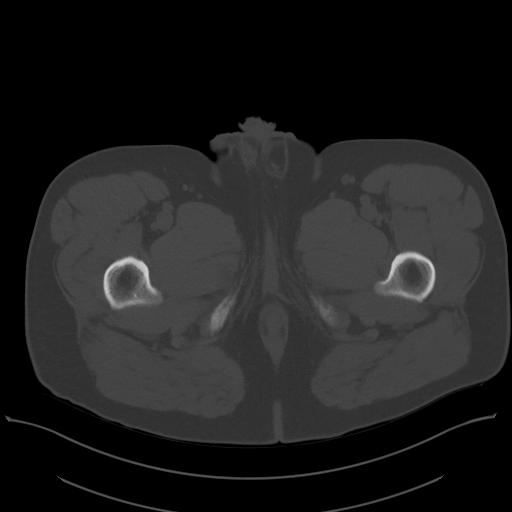
[im 16/117  soft-tissue]
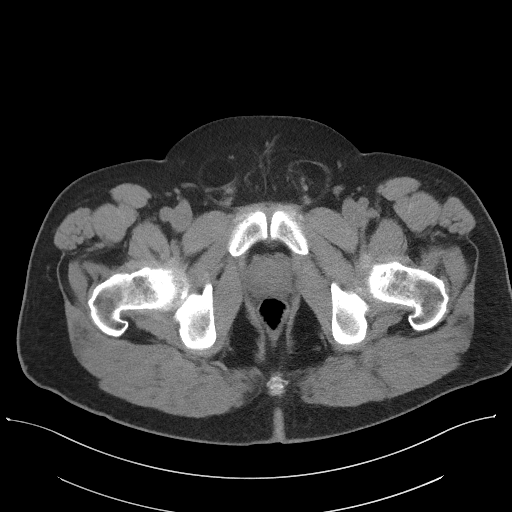
[im 26/117  soft-tissue]
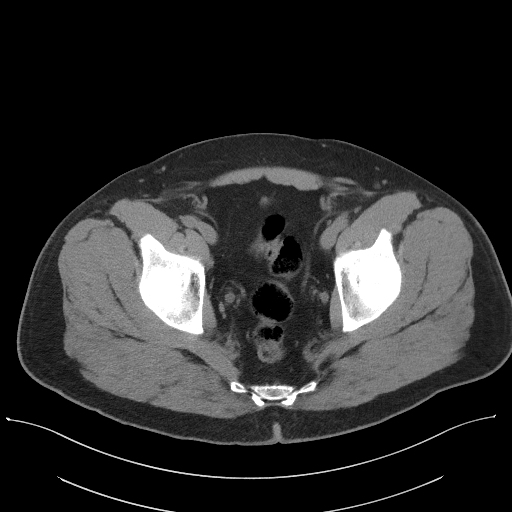
[im 31/117  soft-tissue]
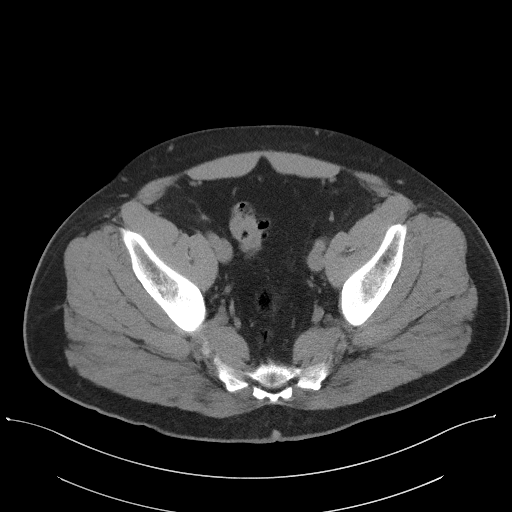
[im 41/117  soft-tissue]
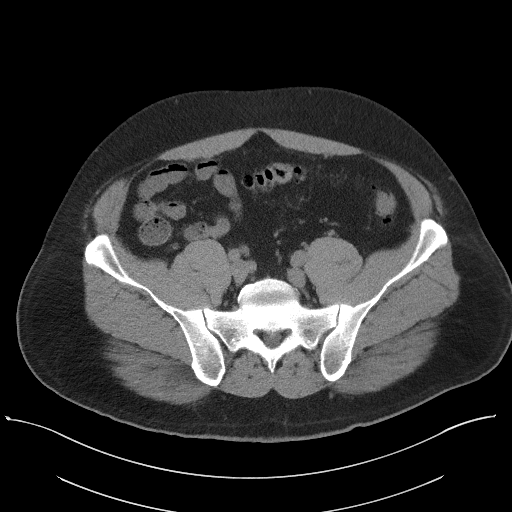
[im 51/117  soft-tissue]
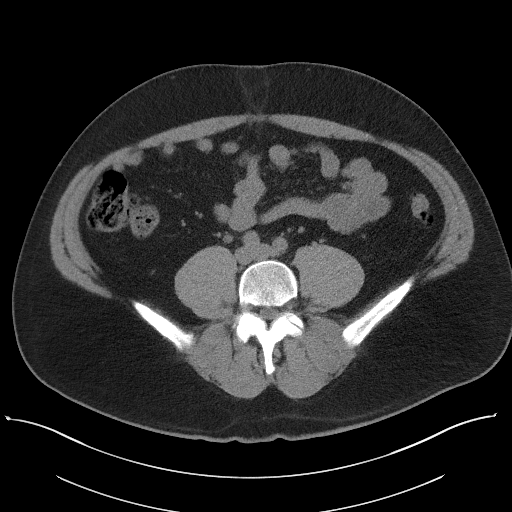
[im 61/117  soft-tissue]
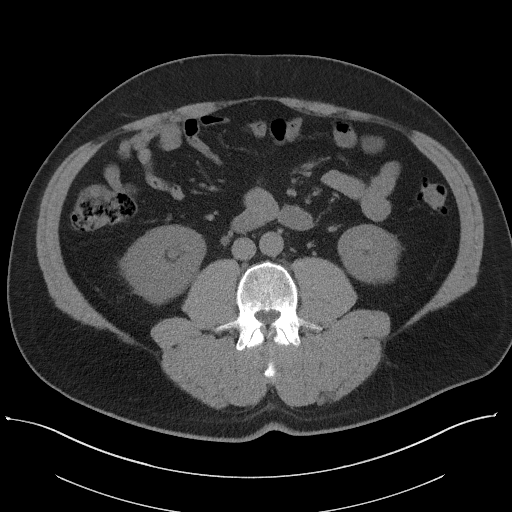
[im 66/117  soft-tissue]
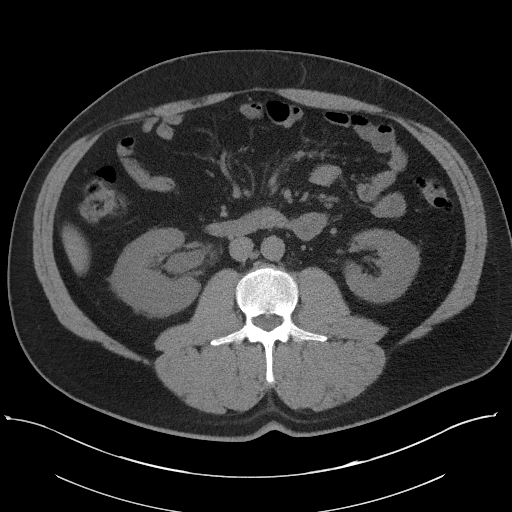
[im 76/117  soft-tissue]
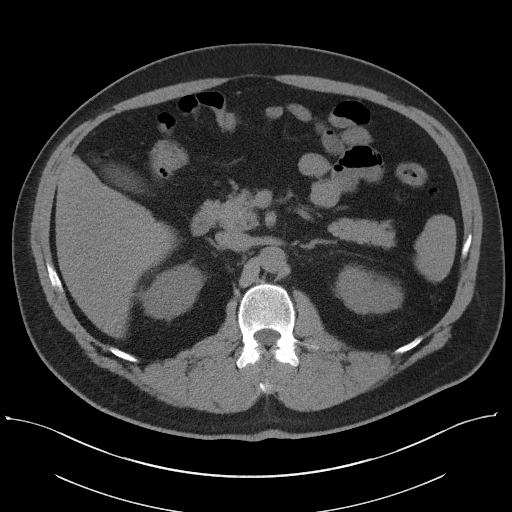
[im 76/117  bone]
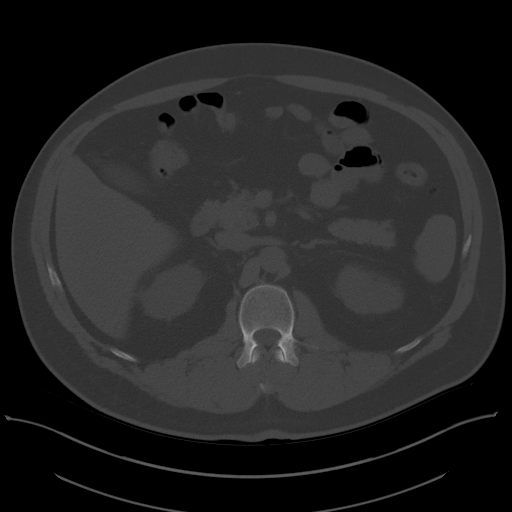
[im 86/117  soft-tissue]
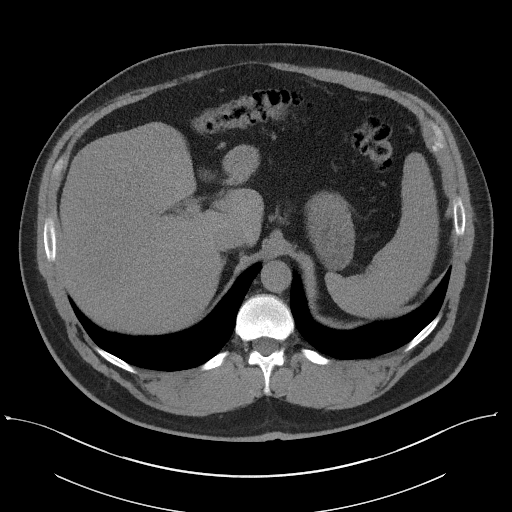
[im 91/117  soft-tissue]
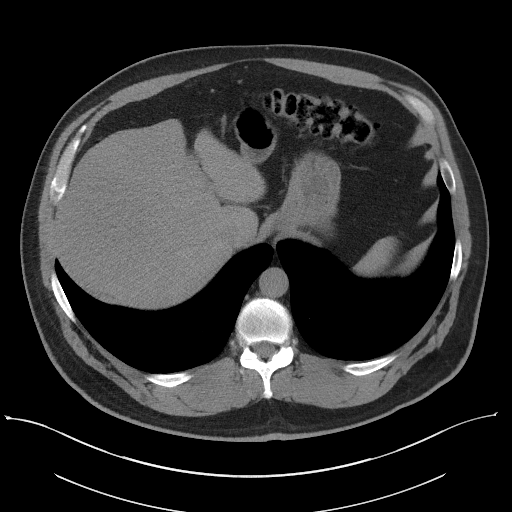
[im 101/117  soft-tissue]
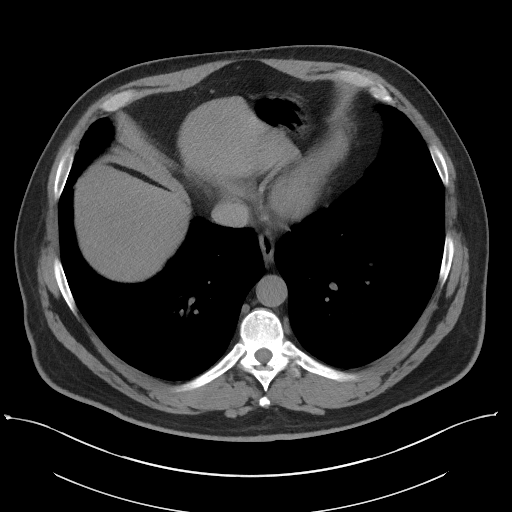
[im 111/117  soft-tissue]
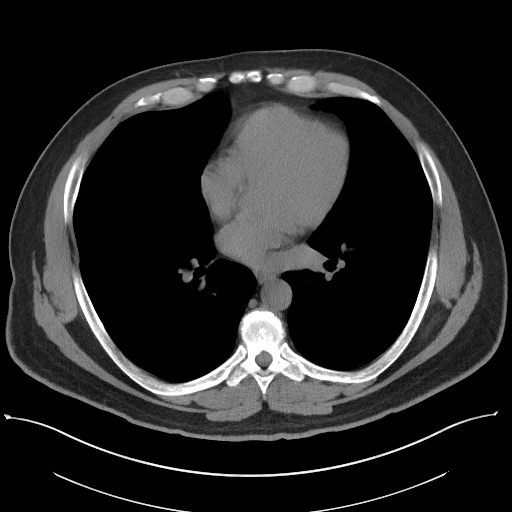

[Series 5: coronal · coronal · 0.88mm/px · 3 of 162 slices shown]
[im 54/162  soft-tissue]
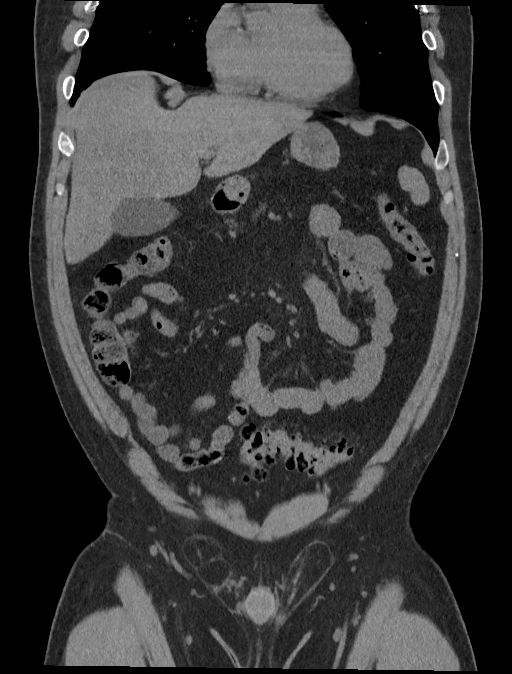
[im 72/162  soft-tissue]
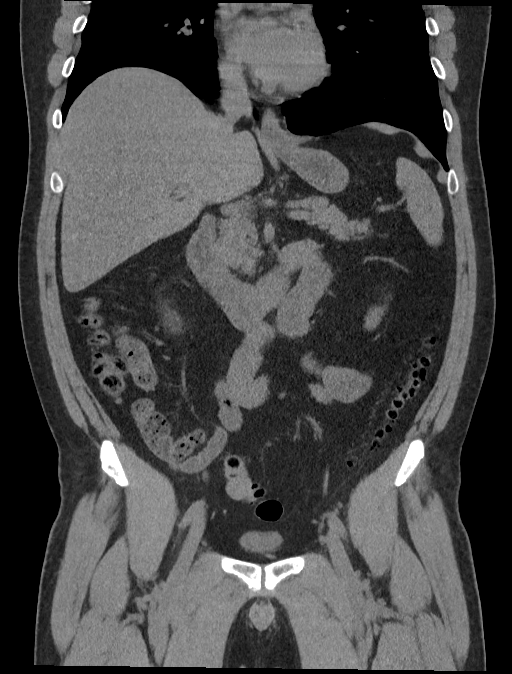
[im 90/162  soft-tissue]
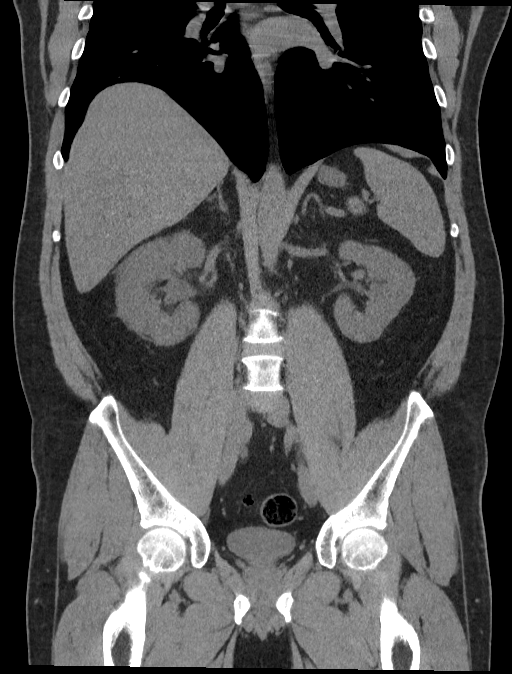

[16 of 46 positions shown; findings below may reference images not displayed]

FINDINGS: Lower chest: Minimal linear subsegmental atelectasis LEFT lower lobe

Hepatobiliary: Gallbladder and liver normal appearance

Pancreas: Normal appearance

Spleen: Normal appearance

Adrenals/Urinary Tract: Adrenal glands and LEFT kidney normal
appearance. Minimal RIGHT renal enlargement and perinephric edema.
Mild RIGHT hydronephrosis and hydroureter secondary to a tiny RIGHT
ureterovesical junction calculus, best demonstrated on coronal
images. LEFT ureter decompressed. Remainder of bladder unremarkable.

Stomach/Bowel: Normal appendix. Diverticulosis of descending and
sigmoid colon without evidence of diverticulitis. Portions of the
sigmoid colon are under distended, unable to exclude wall thickening
in this setting. Small bowel loops unremarkable. Stomach normal
appearance.

Vascular/Lymphatic: Aorta normal caliber.  No adenopathy.

Reproductive: Prostate gland and seminal vesicles unremarkable

Other: BILATERAL inguinal hernias containing fat. Umbilical hernia
containing fat. No free air or free fluid.

Musculoskeletal: Osseous structures unremarkable.
IMPRESSION: RIGHT hydronephrosis and hydroureter secondary to a tiny RIGHT
ureterovesical junction calculus best demonstrated on coronal
images.

Distal colonic diverticulosis without evidence of diverticulitis.

## 2021-12-03 ENCOUNTER — Encounter: Payer: Self-pay | Admitting: Intensive Care

## 2021-12-03 ENCOUNTER — Emergency Department: Payer: PRIVATE HEALTH INSURANCE

## 2021-12-03 ENCOUNTER — Emergency Department
Admission: EM | Admit: 2021-12-03 | Discharge: 2021-12-03 | Disposition: A | Discharge status: Home / Self Care | Payer: PRIVATE HEALTH INSURANCE | Attending: Emergency Medicine | Admitting: Emergency Medicine

## 2021-12-03 ENCOUNTER — Other Ambulatory Visit: Payer: Self-pay

## 2021-12-03 DIAGNOSIS — R739 Hyperglycemia, unspecified: Secondary | ICD-10-CM | POA: Insufficient documentation

## 2021-12-03 DIAGNOSIS — K219 Gastro-esophageal reflux disease without esophagitis: Secondary | ICD-10-CM | POA: Diagnosis not present

## 2021-12-03 DIAGNOSIS — R0602 Shortness of breath: Secondary | ICD-10-CM | POA: Insufficient documentation

## 2021-12-03 DIAGNOSIS — R0789 Other chest pain: Secondary | ICD-10-CM

## 2021-12-03 DIAGNOSIS — I10 Essential (primary) hypertension: Secondary | ICD-10-CM | POA: Diagnosis not present

## 2021-12-03 DIAGNOSIS — R079 Chest pain, unspecified: Secondary | ICD-10-CM | POA: Diagnosis present

## 2021-12-03 HISTORY — DX: Anxiety disorder, unspecified: F41.9

## 2021-12-03 LAB — CBC
HCT: 47.2 % (ref 39.0–52.0)
Hemoglobin: 15.8 g/dL (ref 13.0–17.0)
MCH: 29.2 pg (ref 26.0–34.0)
MCHC: 33.5 g/dL (ref 30.0–36.0)
MCV: 87.2 fL (ref 80.0–100.0)
Platelets: 230 10*3/uL (ref 150–400)
RBC: 5.41 MIL/uL (ref 4.22–5.81)
RDW: 12.5 % (ref 11.5–15.5)
WBC: 8.4 10*3/uL (ref 4.0–10.5)
nRBC: 0 % (ref 0.0–0.2)

## 2021-12-03 LAB — BASIC METABOLIC PANEL
Anion gap: 7 (ref 5–15)
BUN: 18 mg/dL (ref 6–20)
CO2: 23 mmol/L (ref 22–32)
Calcium: 9.2 mg/dL (ref 8.9–10.3)
Chloride: 107 mmol/L (ref 98–111)
Creatinine, Ser: 1.3 mg/dL — ABNORMAL HIGH (ref 0.61–1.24)
GFR, Estimated: >60 mL/min (ref >60)
Glucose, Bld: 128 mg/dL — ABNORMAL HIGH (ref 70–99)
Potassium: 3.8 mmol/L (ref 3.5–5.1)
Sodium: 137 mmol/L (ref 135–145)

## 2021-12-03 LAB — TROPONIN I (HIGH SENSITIVITY)
Troponin I (High Sensitivity): 5 ng/L (ref <18)
Troponin I (High Sensitivity): 4 ng/L (ref <18)

## 2021-12-03 NOTE — ED Provider Notes (Signed)
[facility] Provider Note    Event Date/Time   First MD Initiated Contact with Patient 12/03/21 1125     (approximate)   History   Chest Pain   HPI  Kurt Williamson is a 51 y.o. male with a history of HTN, intermittent reflux presents to the ER today with complaint of centralized chest pain with associated shortness of breath, nausea and left arm tingling.  He reports this occurred while he was sitting at his desk at work.  He reports the symptoms were intermittent but lasted for about 15 to 20 minutes.  He describes the chest pain as sharp in nature that radiated through to his back.  He has never had chest pain like this in the past but has had reflux which has felt somewhat similar in nature.  He reports he takes Nexium as needed.  He reports he contracted COVID 1 year ago and underwent a subsequent EKG and stress test by his PCP which were normal.  He does admit feeling some anxiety when he noticed the chest pain but does not typically have anxiety and is not currently undergoing treatment for this.      Physical Exam   Triage Vital Signs: ED Triage Vitals  Enc Vitals Group     BP 12/03/21 0854 121/62     Pulse Rate 12/03/21 0854 (!) 104     Resp 12/03/21 0854 20     Temp 12/03/21 0854 99.4 F (37.4 C)     Temp Source 12/03/21 0854 Oral     SpO2 12/03/21 0854 98 %     Weight 12/03/21 0852 280 lb (127 kg)     Height 12/03/21 0852 6' 2"  (1.88 m)     Head Circumference --      Peak Flow --      Pain Score 12/03/21 0852 5     Pain Loc --      Pain Edu? --      Excl. in [place]? --     Most recent vital signs: Vitals:   12/03/21 1124 12/03/21 1130  BP: (!) 144/94 113/73  Pulse: 87 81  Resp: 18 17  Temp: 98.6 F (37 C)   SpO2: 96% 95%     General: Awake, obese, no distress.  CV:  RRR, no murmur noted.  Radial pulse 2+ bilaterally. Resp:  Normal effort.  CTA bilaterally. Abd:  No distention.  No pain with palpation in the epigastric  region.   ED Results / Procedures / Treatments   Labs  Labs Reviewed  BASIC METABOLIC PANEL - Abnormal; Notable for the following components:      Result Value   Glucose, Bld 128 (*)    Creatinine, Ser 1.30 (*)    All other components within normal limits  CBC  TROPONIN I (HIGH SENSITIVITY)  TROPONIN I (HIGH SENSITIVITY)     EKG  ED ECG REPORT   Date: 12/03/2021  EKG Time: 12:49 PM  Rate: 112  Rhythm: sinus tachycardia,  normal EKG, normal sinus rhythm, unchanged from previous tracings  Axis: right axis deviation  Intervals:none  ST&T Change: none  Narrative Interpretation: Sinus tachycardia, no evidence of ACS per my read.  Unchanged from prior ECG 06/2019.   RADIOLOGY Imaging Orders         DG Chest 2 View     IMPRESSION: No active cardiopulmonary disease.   PROCEDURES:  Critical Care performed: No  Procedures   MEDICATIONS ORDERED IN ED: Medications -  No data to display   IMPRESSION / MDM / [place] / ED COURSE  I reviewed the triage vital signs and the nursing notes.                              Differential diagnosis includes, but is not limited to, acute coronary syndrome, pneumonia, pulmonary embolism, gastritis, GERD with esophagitis, acute pancreatitis, cholecystitis, musculoskeletal chest pain  Patient's presentation is most consistent with acute presentation with potential threat to life or bodily function.  CBC does not show any evidence of leukocytosis indicating infection C-Met shows a slightly elevated glucose and slightly elevated creatinine but normal GFR.  No evidence of electrolyte abnormality Troponin negative Chest x-ray does not show any evidence of infiltrate per my interpretation, confirmed by radiology ECG showing sinus tachycardia, unchanged from previous tracing Patient did have a grilled chicken egg and cheese from [employer] in the morning prior to onset of symptoms.  Discussed that this could be reflux.  Advised  him to take the Nexium OTC as needed. Offered referral back to cardiology for repeat stress test but he declines at this time and prefers to follow-up with his PCP He is agreeable to discharge with close PCP follow-up.  FINAL CLINICAL IMPRESSION(S) / ED DIAGNOSES   Final diagnoses:  Other chest pain  Gastroesophageal reflux disease without esophagitis     Rx / DC Orders   ED Discharge Orders     None        Note:  This document was prepared using Dragon voice recognition software and may include unintentional dictation errors.    Jearld Fenton, NP 12/03/21 1249    Naaman Plummer, MD 12/03/21 [phone]

## 2021-12-03 NOTE — ED Triage Notes (Signed)
Patient c/o central chest tightness while at work with nausea and sob with exertion. C/o left arm tingling and back pain

## 2021-12-03 NOTE — Discharge Instructions (Signed)
You were seen today for chest pain.  Your lab work-up, EKG and chest x-ray were all normal.  This could be reflux and we would recommend that you take the Nexium as needed.  Please follow-up with your PCP for further evaluation.  Return to the ER if you experience severe chest pain or chest tightness.

## 2022-01-19 ENCOUNTER — Other Ambulatory Visit: Payer: Self-pay | Admitting: Internal Medicine

## 2022-01-23 ENCOUNTER — Other Ambulatory Visit: Payer: Self-pay | Admitting: Internal Medicine

## 2022-02-14 IMAGING — CR DG CHEST 2V
2 series · 2 of 2 positions shown · non-contrast
Comparison: June 09, 2019

CLINICAL DATA: Shortness of breath and chest pain

EXAM:
CHEST - 2 VIEW

[chest pa]
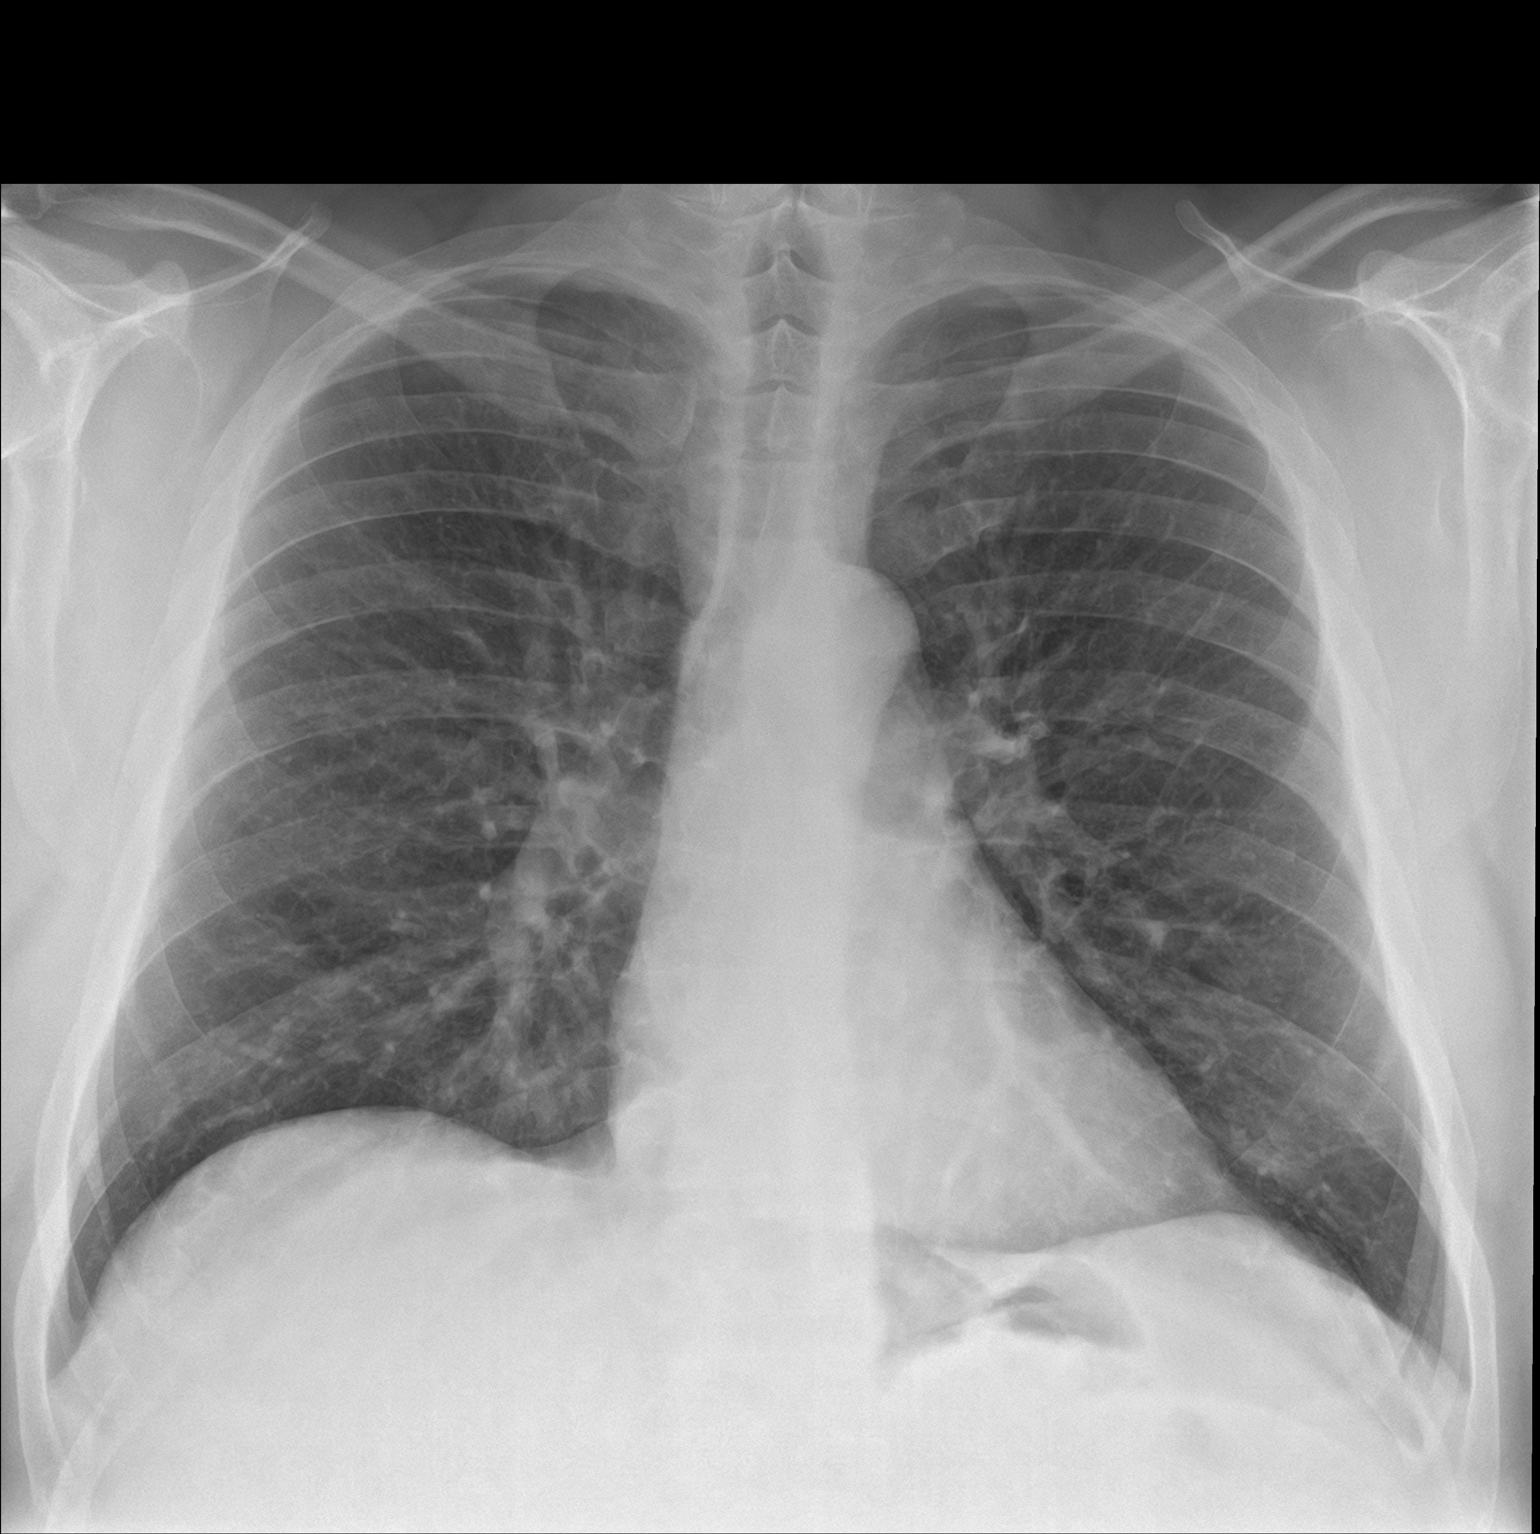

[chest lat]
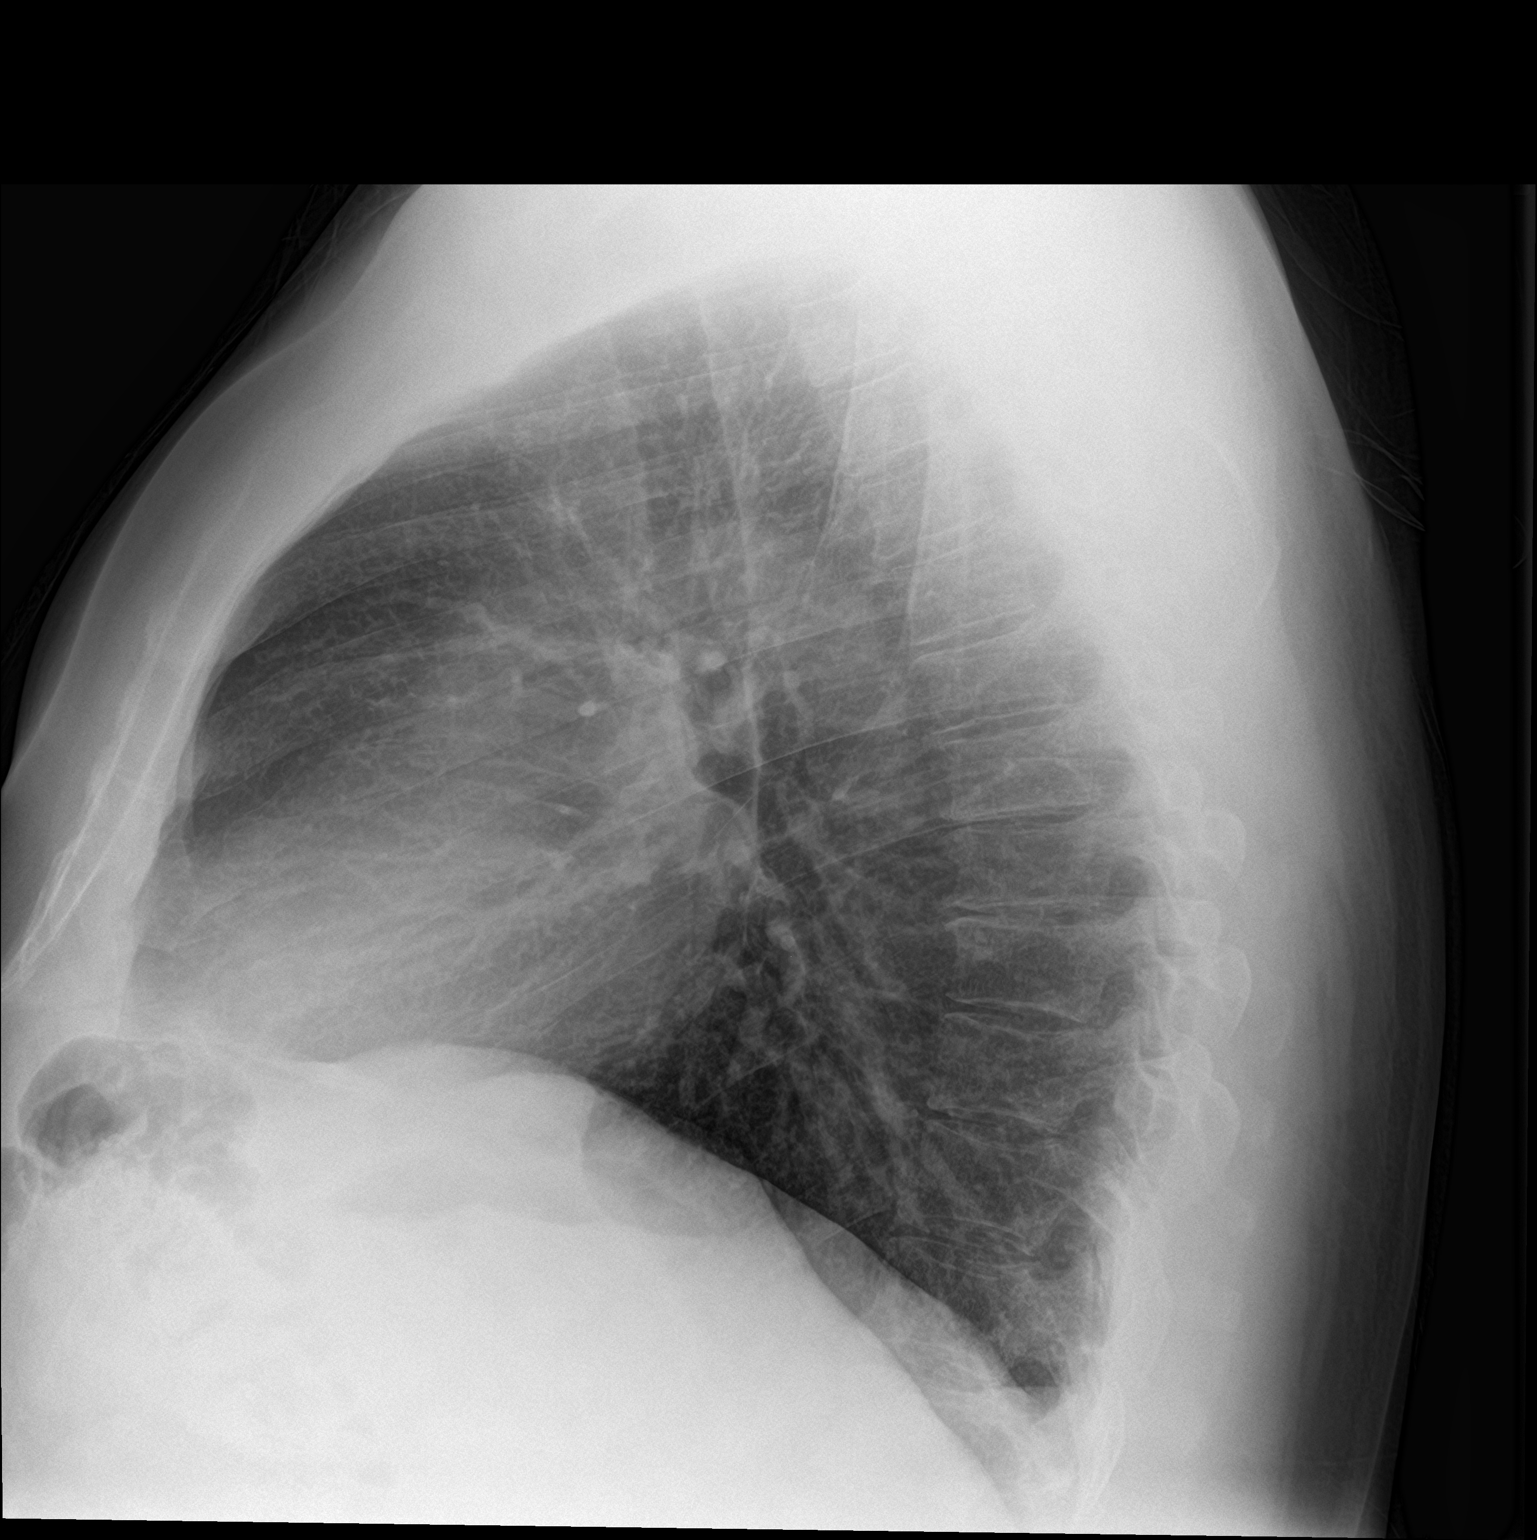

[2 of 2 positions shown; findings below may reference images not displayed]

FINDINGS: Lungs are clear. The heart size and pulmonary vascularity are
normal. No adenopathy. No pneumothorax. No bone lesions.
IMPRESSION: No abnormality noted.

## 2022-02-17 ENCOUNTER — Other Ambulatory Visit: Payer: Self-pay | Admitting: Internal Medicine

## 2022-02-21 ENCOUNTER — Other Ambulatory Visit: Payer: Self-pay | Admitting: Internal Medicine

## 2022-07-06 ENCOUNTER — Ambulatory Visit
Admission: RE | Admit: 2022-07-06 | Discharge: 2022-07-06 | Disposition: A | Discharge status: Home / Self Care | Payer: No Typology Code available for payment source | Source: Ambulatory Visit | Attending: Emergency Medicine | Admitting: Emergency Medicine

## 2022-07-06 VITALS — BP 125/84 | HR 71 | Temp 99.0°F | Resp 20

## 2022-07-06 DIAGNOSIS — M5010 Cervical disc disorder with radiculopathy, unspecified cervical region: Secondary | ICD-10-CM | POA: Diagnosis not present

## 2022-07-06 MED ORDER — BACLOFEN 10 MG PO TABS: 10.0000 mg | ORAL_TABLET | Freq: Three times a day (TID) | ORAL | 0 refills | Status: DC

## 2022-07-06 MED ORDER — PREDNISONE 10 MG (21) PO TBPK: ORAL_TABLET | ORAL | 0 refills | Status: DC

## 2022-07-06 MED ORDER — HYDROCODONE-ACETAMINOPHEN 5-325 MG PO TABS: 2.0000 | ORAL_TABLET | ORAL | 0 refills | Status: DC | PRN

## 2022-07-06 NOTE — Discharge Instructions (Addendum)
Start the prednisone for Singh in the morning and take it at breakfast time to help decrease the inflammation of the nerve in your neck and shoulder causing your pain.  You will take it each morning breakfast time for 6 days and you will decrease your dosing each day according to the package insert.  You may start the baclofen today and take 1 tablet every 8 hours to help with muscle tension which may be exacerbating the pressure on the nerve causing your pain.  You may use the Norco as needed for severe pain.  Be mindful of this medication will sedate you so do not drink alcohol or drive if you take it.  It does contain Tylenol so do not take it with other Tylenol containing products.  Follow the physical therapy exercises given your discharge instructions.  Talk with your primary care provider should your symptoms continue as you may need a referral to the spine clinic for targeted injections or possible other therapeutic intervention.

## 2022-07-06 NOTE — ED Provider Notes (Signed)
[facility]    CSN: [id number] Arrival date & time: 07/06/22  1237      History   Chief Complaint Chief Complaint  Patient presents with   Shoulder Pain    Entered by patient    HPI Kurt SILVESTRO is a 52 y.o. male.   HPI  52 year old male here for evaluation of left shoulder pain.  Patient has a past medical history that significant for hypertension, post-COVID chronic fatigue, and anxiety presenting for evaluation of 3 days worth of pain in his left shoulder.  He states the pain comes on the back of his left shoulder and the back of his left upper arm and also comes across the top of his left shoulder.  Approximately 1 month ago he was pulled by his dog and started having pain.  He was seen at [facility] clinic and diagnosed with cervical radiculopathy and was prescribed hydrocodone, prednisone, and a muscle laxer that helped improve his symptoms.  He states he is not sure what triggered this current round of pain though he states he has not been taking it easy as he was advised to do back in February.  He has been bending over, raising his arms over his head, and lifting heavy objects.  He denies any numbness, tingling, or weakness in his arm.  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 No pertinent surgical history.     Home Medications    Prior to Admission medications   Medication Sig Start Date End Date Taking? Authorizing Provider  baclofen (LIORESAL) 10 MG tablet Take 1 tablet (10 mg total) by mouth 3 (three) times daily. 07/06/22  Yes Margarette Canada, NP  HYDROcodone-acetaminophen (NORCO/VICODIN) 5-325 MG  tablet Take 2 tablets by mouth every 4 (four) hours as needed. 07/06/22  Yes Margarette Canada, NP  predniSONE (STERAPRED UNI-PAK 21 TAB) 10 MG (21) TBPK tablet Take 6 tablets on day 1, 5 tablets day 2, 4 tablets day 3, 3 tablets day 4, 2 tablets day 5, 1 tablet day 6 07/06/22  Yes Margarette Canada, NP  metoprolol succinate (TOPROL-XL) 50 MG 24 hr tablet TAKE 1 TABLET(50 MG) BY MOUTH DAILY WITH OR IMMEDIATELY FOLLOWING A MEAL 01/23/22   Cletis Athens, MD  PARoxetine (PAXIL) 10 MG tablet TAKE 1 TABLET BY MOUTH DAILY 07/04/21   Cletis Athens, MD  Potassium Chloride ER 20 MEQ TBCR TAKE 1 TABLET BY MOUTH DAILY 02/21/22   Cletis Athens, MD  telmisartan-hydrochlorothiazide (MICARDIS HCT) 80-12.5 MG tablet TAKE 1 TABLET BY MOUTH DAILY 02/17/22   Cletis Athens, MD    Family History Family History  Problem Relation Age of Onset   Hypertension Father     Social History Social History   Tobacco Use   Smoking status: Never   Smokeless tobacco: Never  Vaping Use   Vaping Use: Never used  Substance Use Topics   Alcohol use: Not Currently   Drug use: Never     Allergies   Patient has no known allergies.   Review of Systems Review of Systems  Constitutional:  Negative for fever.  Musculoskeletal:  Negative for arthralgias.  Pain in the back of the left shoulder and down the back of the left upper arm.  Neurological:  Negative for weakness and numbness.     Physical Exam Triage Vital Signs ED Triage Vitals  Enc Vitals Group     BP      Pulse      Resp      Temp      Temp src      SpO2      Weight      Height      Head Circumference      Peak Flow      Pain Score      Pain Loc      Pain Edu?      Excl. in [place]?    No data found.  Updated Vital Signs BP 125/84   Pulse 71   Temp 99 F (37.2 C)   Resp 20   SpO2 100%   Visual Acuity Right Eye Distance:   Left Eye Distance:   Bilateral Distance:    Right Eye Near:   Left Eye Near:    Bilateral Near:     Physical  Exam Vitals and nursing note reviewed.  Constitutional:      Appearance: Normal appearance. He is not ill-appearing.  HENT:     Head: Normocephalic and atraumatic.  Musculoskeletal:        General: Tenderness present. No swelling, deformity or signs of injury. Normal range of motion.  Skin:    General: Skin is warm and dry.     Capillary Refill: Capillary refill takes less than 2 seconds.  Neurological:     General: No focal deficit present.     Mental Status: He is alert and oriented to person, place, and time.      UC Treatments / Results  Labs (all labs ordered are listed, but only abnormal results are displayed) Labs Reviewed - No data to display  EKG   Radiology No results found.  Procedures Procedures (including critical care time)  Medications Ordered in UC Medications - No data to display  Initial Impression / Assessment and Plan / UC Course  I have reviewed the triage vital signs and the nursing notes.  Pertinent labs & imaging results that were available during my care of the patient were reviewed by me and considered in my medical decision making (see chart for details).  The patient is a pleasant, nontoxic-appearing 52 year old male here for evaluation of 3 days worth of left shoulder pain.  The pain is similar to that which he experienced a month ago when he was diagnosed with cervical radiculopathy and a pinched nerve in his neck.  At that time he had a discrete injury of his dog pulling him on the leash but this time he does not remember any particular injury.  He states he has been overdoing it with physical activity and he thinks this is what is aggravated it.  On exam he has got 5/5 grip strength and both hands as well as 5/5 resisted flexion, abduction, and extension of his upper arms.  No spinous process tenderness or step-off.  No cervical paraspinous tenderness or spasm.  He does have some mild tenderness with palpation of his trapezius muscle between the  vertebral spine of the scapula and his thoracic spine which may represent a trigger point.  Most likely given that the pain is going down the back of his left upper arm this is coming from his cervical region.  I will treat him for cervical radiculopathy with home physical therapy, prednisone taper, and baclofen.  I will also give him a short prescription for Norco to bridge him until he can start the prednisone in the morning.  PDMP reviewed and does not show any open narcotic prescription since May 29, 2022.   Final Clinical Impressions(s) / UC Diagnoses   Final diagnoses:  Cervical disc disorder with radiculopathy of cervical region     Discharge Instructions      Start the prednisone for Singh in the morning and take it at breakfast time to help decrease the inflammation of the nerve in your neck and shoulder causing your pain.  You will take it each morning breakfast time for 6 days and you will decrease your dosing each day according to the package insert.  You may start the baclofen today and take 1 tablet every 8 hours to help with muscle tension which may be exacerbating the pressure on the nerve causing your pain.  You may use the Norco as needed for severe pain.  Be mindful of this medication will sedate you so do not drink alcohol or drive if you take it.  It does contain Tylenol so do not take it with other Tylenol containing products.  Follow the physical therapy exercises given your discharge instructions.  Talk with your primary care provider should your symptoms continue as you may need a referral to the spine clinic for targeted injections or possible other therapeutic intervention.     ED Prescriptions     Medication Sig Dispense Auth. Provider   predniSONE (STERAPRED UNI-PAK 21 TAB) 10 MG (21) TBPK tablet Take 6 tablets on day 1, 5 tablets day 2, 4 tablets day 3, 3 tablets day 4, 2 tablets day 5, 1 tablet day 6 21 tablet Margarette Canada, NP   baclofen (LIORESAL) 10  MG tablet Take 1 tablet (10 mg total) by mouth 3 (three) times daily. 30 each Margarette Canada, NP   HYDROcodone-acetaminophen (NORCO/VICODIN) 5-325 MG tablet Take 2 tablets by mouth every 4 (four) hours as needed. 6 tablet Margarette Canada, NP      I have reviewed the PDMP during this encounter.   Margarette Canada, NP 07/06/22 1322

## 2022-07-06 NOTE — ED Triage Notes (Signed)
Pt c/o left shoulder pain that initially started over 1 mth ago. Was dx with pinched nerve Feb. 5th was given steroids and pain medication. Was feeling better and pain came back 2 days ago after Dog was on lease pulled pt and has been having pain since.

## 2022-07-20 DIAGNOSIS — M542 Cervicalgia: Secondary | ICD-10-CM | POA: Insufficient documentation

## 2022-07-21 ENCOUNTER — Ambulatory Visit: Payer: No Typology Code available for payment source | Admitting: Physician Assistant

## 2022-07-21 ENCOUNTER — Encounter: Payer: Self-pay | Admitting: Physician Assistant

## 2022-07-21 VITALS — BP 130/78 | HR 75 | Temp 98.6°F | Ht 74.0 in | Wt 288.0 lb

## 2022-07-21 DIAGNOSIS — Z0000 Encounter for general adult medical examination without abnormal findings: Secondary | ICD-10-CM

## 2022-07-21 DIAGNOSIS — M5412 Radiculopathy, cervical region: Secondary | ICD-10-CM | POA: Diagnosis not present

## 2022-07-21 DIAGNOSIS — Z13220 Encounter for screening for lipoid disorders: Secondary | ICD-10-CM

## 2022-07-21 DIAGNOSIS — Z131 Encounter for screening for diabetes mellitus: Secondary | ICD-10-CM

## 2022-07-21 DIAGNOSIS — M5442 Lumbago with sciatica, left side: Secondary | ICD-10-CM | POA: Insufficient documentation

## 2022-07-21 DIAGNOSIS — Z6835 Body mass index (BMI) 35.0-35.9, adult: Secondary | ICD-10-CM

## 2022-07-21 DIAGNOSIS — M542 Cervicalgia: Secondary | ICD-10-CM

## 2022-07-21 MED ORDER — MELOXICAM 15 MG PO TABS: 15.0000 mg | ORAL_TABLET | Freq: Every day | ORAL | 0 refills | Status: DC

## 2022-07-21 MED ORDER — GABAPENTIN 300 MG PO CAPS: 300.0000 mg | ORAL_CAPSULE | Freq: Three times a day (TID) | ORAL | 0 refills | Status: DC

## 2022-07-21 NOTE — Progress Notes (Signed)
Date:  07/21/2022   Name:  Kurt Williamson   DOB:  01-28-1971   MRN:  [id number]   Chief Complaint: Establish Care, Shoulder Pain (Pinched nerve), and Leg Pain (Pinched nerve )   HPI Reyniel Gilcrest" Flath is a very pleasant 52 year old male with a history of hypertension and obesity who presents new to our clinic today for evaluation of subacute (2 months) left shoulder pain and paresthesia down the arm following injury/accident.  Mechanism of injury described as in "unnatural pull/twist" which occurred while getting something out of his vehicle when his dog (greyhound) bolted to chase something while patient was holding the leash, resulting in a sudden traction of the extended left arm in the posterolateral direction.  This was evaluated 05/29/2022 with [facility] clinic internal medicine where x-rays were taken of the left shoulder and C-spine; I do not have the results from these studies, but patient tells me that there were no fractures or disc herniations but the provider told him the curvature of his cervical spine was "not quite what it should be".  Patient was diagnosed with cervical radiculopathy and treated with 12 days prednisone, Skelaxin, and Norco PRN.   This regimen did very well for Kurt Williamson, until around 07/06/2022 when he presented to the ED for further evaluation of shoulder pain.  He tells me he thinks he "overdid it at work" causing a flare in pain and inflammation.  He was again treated with 6-day prednisone pack, baclofen, Norco.  07/12/2022 saw [facility] clinic internal medicine again for follow-up, but this time with new intermittent paresthesia of the left buttock traveling down the leg.  Lumbar x-ray with mild degenerative changes and mildly narrowed L4-L5.  Diagnosed with sciatica and once again treated with 12 days prednisone, Skelaxin, Norco  Today, patient endorses continued intermittent "tingling, electric" pain and paresthesias from the neck radiating down the left arm to  the 1st-3rd fingers, 3/10 intensity, worse with certain activities or positions.  Still having sciatica, described as stabbing/electric/tension/pressure 3/10 intensity felt from the left buttock along the back of the leg and into the bottom of the foot, more noticeable when the leg is straight such as when standing especially after a period of sitting.  Once ambulating, this sensation subsides.  Performing informal at-home PT/stretching exercises as directed by internal medicine which he thinks may be helping.  IcyHot patches also seem to help quite a bit.  Only rarely uses Norco, does not like to take this medication.  Says he prefers baclofen to Skelaxin.  Has only a few days of prednisone left.  Emphasizes that the pain is not particularly severe or debilitating, just seeking reassurance that there is not more serious pathology going on at the root of these symptoms.  Clarifies that he did not have any similar symptoms prior to the injury with his dog.  He denies numbness, functional loss, peripheral weakness, saddle paresthesia, bladder/bowel incontinence, changes to vision/hearing, dizziness.   Recent Labs     Component Value Date/Time   NA 137 12/03/2021 0853   NA 140 11/17/2016 0928   K 3.8 12/03/2021 0853   CL 107 12/03/2021 0853   CO2 23 12/03/2021 0853   GLUCOSE 128 (H) 12/03/2021 0853   BUN 18 12/03/2021 0853   BUN 16 11/17/2016 0928   CREATININE 1.30 (H) 12/03/2021 0853   CREATININE 1.05 01/03/2021 0935   CALCIUM 9.2 12/03/2021 0853   PROT 8.4 (H) 01/03/2021 0935   PROT 7.5 11/17/2016 0928   ALBUMIN 4.8  02/21/2019 1320   ALBUMIN 4.6 11/17/2016 0928   AST 18 01/03/2021 0935   ALT 30 01/03/2021 0935   ALKPHOS 52 02/21/2019 1320   BILITOT 0.7 01/03/2021 0935   BILITOT 0.6 11/17/2016 0928   GFRNONAA >60 12/03/2021 0853   GFRAA >60 07/11/2019 1509    Lab Results  Component Value Date   WBC 8.4 12/03/2021   HGB 15.8 12/03/2021   HCT 47.2 12/03/2021   MCV 87.2 12/03/2021   PLT  230 12/03/2021   Lab Results  Component Value Date   HGBA1C 5.3 11/17/2016   Lab Results  Component Value Date   CHOL 218 (H) 01/03/2021   HDL 39 (L) 01/03/2021   LDLCALC 139 (H) 01/03/2021   TRIG 247 (H) 01/03/2021   CHOLHDL 5.6 (H) 01/03/2021   Lab Results  Component Value Date   TSH 2.16 01/03/2021    Review of Systems  Constitutional:  Negative for fever.  Musculoskeletal:  Positive for neck pain. Negative for neck stiffness.  Neurological:  Negative for dizziness, weakness and numbness.       Nerve pain (LUE. 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  Current Meds  Medication Sig   baclofen (LIORESAL) 10 MG tablet Take 1 tablet (10 mg total) by mouth 3 (three) times daily.   gabapentin (NEURONTIN) 300 MG capsule Take 1 capsule (300 mg total) by mouth 3 (three) times daily. Day 1 - Take once. Day 2 - Take twice. Day 3 and after - Take up to three times daily   HYDROcodone-acetaminophen (NORCO/VICODIN) 5-325 MG tablet Take 2 tablets by mouth every 4 (four) hours as needed.   meloxicam (MOBIC) 15 MG tablet Take 1 tablet (15 mg total) by mouth daily. Take with a full meal to minimize GI discomfort   metoprolol succinate (TOPROL-XL) 50 MG 24 hr tablet TAKE 1 TABLET(50 MG) BY MOUTH DAILY WITH OR IMMEDIATELY FOLLOWING A MEAL    Potassium Chloride ER 20 MEQ TBCR TAKE 1 TABLET BY MOUTH DAILY   predniSONE (STERAPRED UNI-PAK 21 TAB) 10 MG (21) TBPK tablet Take 6 tablets on day 1, 5 tablets day 2, 4 tablets day 3, 3 tablets day 4, 2 tablets day 5, 1 tablet day 6   telmisartan-hydrochlorothiazide (MICARDIS HCT) 80-12.5 MG tablet TAKE 1 TABLET BY MOUTH DAILY       07/21/2022    9:02 AM  GAD 7 : Generalized Anxiety Score  Nervous, Anxious, on Edge 0  Control/stop worrying 0  Worry too much - different things 0  Trouble relaxing 0  Restless 0  Easily annoyed or irritable 0  Afraid - awful might happen 0  Total GAD 7 Score 0  Anxiety Difficulty Not difficult at all  07/21/2022    9:02 AM 01/05/2021    4:10 PM  Depression screen PHQ 2/9  Decreased Interest 0 0  Down, Depressed, Hopeless 0 0  PHQ - 2 Score 0 0  Altered sleeping 0   Tired, decreased energy 0   Change in appetite 0   Feeling bad or failure about yourself  0   Trouble concentrating 0   Moving slowly or fidgety/restless 0   Suicidal thoughts 0   PHQ-9 Score 0   Difficult doing work/chores Not difficult at all     BP Readings from Last 3 Encounters:  07/21/22 130/78  07/06/22 125/84  12/03/21 113/73    Physical Exam Vitals and nursing note reviewed.  Constitutional:      Appearance: Normal appearance.  Neck:     Comments: Full AROM of neck without notable tension or stiffness Cardiovascular:     Rate and Rhythm: Normal rate and regular rhythm.     Heart sounds: Normal heart sounds.  Pulmonary:     Effort: Pulmonary effort is normal.     Breath sounds: Normal breath sounds.  Musculoskeletal:        General: No deformity.     Left shoulder: No deformity or crepitus. Normal range of motion.     Left upper arm: Tenderness present.       Arms:       Back:     Comments: No midline spinal deformity or tenderness along the length of the spine.  There is a 2 cm tender knot just left of midline near L4-L5.   Full AROM at the  left shoulder without pain.  There is focal tenderness at the proximal left triceps and deep to the left scapula.  Palpating these areas reproduces paresthesia distally.  Strength 5/5 at shoulder, elbow, and hand.    Neurological:     Gait: Gait is intact.     Comments: Positive straight leg raise on LEFT     Wt Readings from Last 3 Encounters:  07/21/22 288 lb (130.6 kg)  12/03/21 280 lb (127 kg)  01/05/21 (!) 303 lb 6.4 oz (137.6 kg)    BP 130/78   Pulse 75   Temp 98.6 F (37 C) (Oral)   Ht 6\' 2"  (1.88 m)   Wt 288 lb (130.6 kg)   SpO2 95%   BMI 36.98 kg/m   Assessment and Plan:  1. Cervical radiculopathy due to trauma Patient reassured I believe he has brachial plexus injury as a result of sudden awkward traction from his dog bolting off while leashed.  Education given on brachial plexus/cervicalgia.  Advised to continue the current course of prednisone then follow with prescription NSAID as below.  We discussed risk/benefit of using gabapentin as well up to 3 times daily as needed for nerve pain.  This should also help with sciatica.  Mentioned that it is my hope we will be able to use these medications for about 1 month and then stop them.  Discussed option for formal physical therapy, patient would like to continue with at home physical therapy and stretching for now and I think this is perfectly reasonable.  I see no need for further imaging at this time seeing as he has completed x-ray for cervical spine, lumbar spine, and left shoulder all of which was unrevealing.  If symptoms persist for greater than 1 month, already has sudden worsening of pain or loss of function, consider neurology referral +/- MRI. - meloxicam (MOBIC) 15 MG tablet; Take  1 tablet (15 mg total) by mouth daily. Take with a full meal to minimize GI discomfort  Dispense: 30 tablet; Refill: 0 - gabapentin (NEURONTIN) 300 MG capsule; Take 1 capsule (300 mg total) by mouth 3 (three) times daily. Day 1 - Take once.  Day 2 - Take twice. Day 3 and after - Take up to three times daily  Dispense: 90 capsule; Refill: 0  2. Acute left-sided low back pain with left-sided sciatica Plan as above.  Education given on sciatica.  Continue IcyHot patches as desired.  Might consider topical NSAID such as Voltaren applied to the knot in the lower lumbar area +/- gentle massage - meloxicam (MOBIC) 15 MG tablet; Take 1 tablet (15 mg total) by mouth daily. Take with a full meal to minimize GI discomfort  Dispense: 30 tablet; Refill: 0 - gabapentin (NEURONTIN) 300 MG capsule; Take 1 capsule (300 mg total) by mouth 3 (three) times daily. Day 1 - Take once. Day 2 - Take twice. Day 3 and after - Take up to three times daily  Dispense: 90 capsule; Refill: 0  3. Class 2 severe obesity with serious comorbidity and body mass index (BMI) of 35.0 to 35.9 in adult, unspecified obesity type ([place]) Briefly discussed patient's weight today.  Fortunately, he has done quite well with weight loss over the last 1 to 2 years and is trending in the right direction with positive lifestyle changes.  Congratulated on this.  Will obtain some baseline labs for his overall metabolic picture.  He will return in 3 days for fasting lab work. - CBC with Differential/Platelet - Comprehensive metabolic panel - TSH - Hemoglobin A1c - Lipid panel  4. Screening for diabetes mellitus Obtain A1c - Hemoglobin A1c  5. Screening for hyperlipidemia Check fasting lipid panel   Return in about 4 weeks (around 08/18/2022) for OV nerve issues.   Partially dictated using Editor, commissioning. Any errors are unintentional.  Lupita Leash, PA-C, DMSc, [place] [phone]

## 2022-07-24 NOTE — Telephone Encounter (Signed)
Please review.  KP

## 2022-07-25 LAB — LIPID PANEL
Chol/HDL Ratio: 5.4 ratio — ABNORMAL HIGH (ref 0.0–5.0)
Cholesterol, Total: 193 mg/dL (ref 100–199)
HDL: 36 mg/dL — ABNORMAL LOW (ref >39)
LDL Chol Calc (NIH): 119 mg/dL — ABNORMAL HIGH (ref 0–99)
Triglycerides: 218 mg/dL — ABNORMAL HIGH (ref 0–149)
VLDL Cholesterol Cal: 38 mg/dL (ref 5–40)

## 2022-07-25 LAB — HEMOGLOBIN A1C
Est. average glucose Bld gHb Est-mCnc: 123 mg/dL
Hgb A1c MFr Bld: 5.9 % — ABNORMAL HIGH (ref 4.8–5.6)

## 2022-07-25 LAB — CBC WITH DIFFERENTIAL/PLATELET
Basophils Absolute: 0.1 10*3/uL (ref 0.0–0.2)
Basos: 1 %
EOS (ABSOLUTE): 0.1 10*3/uL (ref 0.0–0.4)
Eos: 2 %
Hematocrit: 49.8 % (ref 37.5–51.0)
Hemoglobin: 16.4 g/dL (ref 13.0–17.7)
Immature Grans (Abs): 0 10*3/uL (ref 0.0–0.1)
Immature Granulocytes: 0 %
Lymphocytes Absolute: 2.6 10*3/uL (ref 0.7–3.1)
Lymphs: 27 %
MCH: 29 pg (ref 26.6–33.0)
MCHC: 32.9 g/dL (ref 31.5–35.7)
MCV: 88 fL (ref 79–97)
Monocytes Absolute: 0.7 10*3/uL (ref 0.1–0.9)
Monocytes: 8 %
Neutrophils Absolute: 5.9 10*3/uL (ref 1.4–7.0)
Neutrophils: 62 %
Platelets: 238 10*3/uL (ref 150–450)
RBC: 5.65 x10E6/uL (ref 4.14–5.80)
RDW: 13.1 % (ref 11.6–15.4)
WBC: 9.5 10*3/uL (ref 3.4–10.8)

## 2022-07-25 LAB — COMPREHENSIVE METABOLIC PANEL
ALT: 18 IU/L (ref 0–44)
AST: 12 IU/L (ref 0–40)
Albumin/Globulin Ratio: 1.7 (ref 1.2–2.2)
Albumin: 4.6 g/dL (ref 4.1–5.1)
Alkaline Phosphatase: 64 IU/L (ref 44–121)
BUN/Creatinine Ratio: 19 (ref 9–20)
BUN: 20 mg/dL (ref 6–24)
Bilirubin Total: 0.9 mg/dL (ref 0.0–1.2)
CO2: 22 mmol/L (ref 20–29)
Calcium: 10.3 mg/dL — ABNORMAL HIGH (ref 8.7–10.2)
Chloride: 103 mmol/L (ref 96–106)
Creatinine, Ser: 1.08 mg/dL (ref 0.76–1.27)
Globulin, Total: 2.7 g/dL (ref 1.5–4.5)
Glucose: 95 mg/dL (ref 70–99)
Potassium: 4.4 mmol/L (ref 3.5–5.2)
Sodium: 141 mmol/L (ref 134–144)
Total Protein: 7.3 g/dL (ref 6.0–8.5)
eGFR: 84 mL/min/{1.73_m2} (ref >59)

## 2022-07-25 LAB — TSH: TSH: 1.14 u[IU]/mL (ref 0.450–4.500)

## 2022-08-18 ENCOUNTER — Encounter: Payer: Self-pay | Admitting: Physician Assistant

## 2022-08-18 ENCOUNTER — Ambulatory Visit: Payer: No Typology Code available for payment source | Admitting: Physician Assistant

## 2022-08-18 VITALS — BP 118/84 | HR 90 | Temp 98.2°F | Ht 74.0 in | Wt 284.0 lb

## 2022-08-18 DIAGNOSIS — M5442 Lumbago with sciatica, left side: Secondary | ICD-10-CM

## 2022-08-18 DIAGNOSIS — H00014 Hordeolum externum left upper eyelid: Secondary | ICD-10-CM | POA: Diagnosis not present

## 2022-08-18 DIAGNOSIS — M5412 Radiculopathy, cervical region: Secondary | ICD-10-CM

## 2022-08-18 DIAGNOSIS — Z8639 Personal history of other endocrine, nutritional and metabolic disease: Secondary | ICD-10-CM

## 2022-08-18 DIAGNOSIS — I10 Essential (primary) hypertension: Secondary | ICD-10-CM | POA: Diagnosis not present

## 2022-08-18 DIAGNOSIS — E782 Mixed hyperlipidemia: Secondary | ICD-10-CM | POA: Insufficient documentation

## 2022-08-18 DIAGNOSIS — R7303 Prediabetes: Secondary | ICD-10-CM

## 2022-08-18 MED ORDER — MELOXICAM 15 MG PO TABS
15.0000 mg | ORAL_TABLET | Freq: Every day | ORAL | 1 refills | Status: DC
Start: 2022-08-18 — End: 2022-10-05

## 2022-08-18 MED ORDER — TELMISARTAN-HCTZ 80-12.5 MG PO TABS: 1.0000 | ORAL_TABLET | Freq: Every day | ORAL | 1 refills | Status: DC

## 2022-08-18 MED ORDER — POTASSIUM CHLORIDE ER 20 MEQ PO TBCR: 1.0000 | EXTENDED_RELEASE_TABLET | Freq: Every day | ORAL | 3 refills | Status: DC

## 2022-08-18 MED ORDER — METOPROLOL SUCCINATE ER 50 MG PO TB24: ORAL_TABLET | ORAL | 1 refills | Status: DC

## 2022-08-18 MED ORDER — GABAPENTIN 300 MG PO CAPS: 300.0000 mg | ORAL_CAPSULE | Freq: Three times a day (TID) | ORAL | 0 refills | Status: DC

## 2022-08-18 NOTE — Progress Notes (Signed)
Date:  08/18/2022   Name:  Kurt Williamson   DOB:  August 19, 1970   MRN:  [id number]   Chief Complaint: Nerve Pain (Pt is doing well on the medication he would like to take it for a few more weeks, feels 60% better/)  HPI Kurt Hua returns for 1 month follow-up on cervical radiculopathy and left sided sciatica following recent evaluation by me 07/21/2022 where prescribed meloxicam and gabapentin.  Initially patient experienced stomach upset from meloxicam and fatigue "like a zombie" from gabapentin, but after an adjustment period he reports medications are well-tolerated and that helped quite a bit with his symptoms.  He would like to continue both medications for the time being.  Also needs refill on blood pressure medicine.  Discovered pre-DM with A1c 5.9% on last labs and mixed mild HLD which we are treating with lifestyle measures.  Separately, he developed a stye of the left upper eyelid about 4 days ago which he has been treating conservatively with hot compresses several times per day.  He states it seems to be expressing itself, noting sticky discharge along the lid margin.  He denies squeezing or rubbing the eyelid.  History of allergies, recently restarted Claritin  Recent Labs     Component Value Date/Time   NA 141 07/24/2022 0819   K 4.4 07/24/2022 0819   CL 103 07/24/2022 0819   CO2 22 07/24/2022 0819   GLUCOSE 95 07/24/2022 0819   GLUCOSE 128 (H) 12/03/2021 0853   BUN 20 07/24/2022 0819   CREATININE 1.08 07/24/2022 0819   CREATININE 1.05 01/03/2021 0935   CALCIUM 10.3 (H) 07/24/2022 0819   PROT 7.3 07/24/2022 0819   ALBUMIN 4.6 07/24/2022 0819   AST 12 07/24/2022 0819   ALT 18 07/24/2022 0819   ALKPHOS 64 07/24/2022 0819   BILITOT 0.9 07/24/2022 0819   GFRNONAA >60 12/03/2021 0853   GFRAA >60 07/11/2019 1509    Lab Results  Component Value Date   WBC 9.5 07/24/2022   HGB 16.4 07/24/2022   HCT 49.8 07/24/2022   MCV 88 07/24/2022   PLT 238 07/24/2022   Lab Results   Component Value Date   HGBA1C 5.9 (H) 07/24/2022   Lab Results  Component Value Date   CHOL 193 07/24/2022   HDL 36 (L) 07/24/2022   LDLCALC 119 (H) 07/24/2022   TRIG 218 (H) 07/24/2022   CHOLHDL 5.4 (H) 07/24/2022   Lab Results  Component Value Date   TSH 1.140 07/24/2022    Review of Systems  Eyes:  Positive for discharge.  Musculoskeletal:  Positive for neck pain.  Neurological:  Negative for weakness and numbness.    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  Current Meds  Medication Sig   [DISCONTINUED] gabapentin (NEURONTIN) 300 MG capsule Take 1 capsule (300 mg total) by mouth 3 (three) times daily. Day 1 - Take once. Day 2 - Take twice. Day 3 and after - Take up to three times daily   [DISCONTINUED] meloxicam (MOBIC) 15 MG tablet Take 1 tablet (15 mg total) by mouth daily. Take with a full meal to minimize GI discomfort   [DISCONTINUED] metoprolol succinate (TOPROL-XL) 50 MG 24 hr tablet TAKE 1 TABLET(50 MG) BY MOUTH DAILY WITH OR IMMEDIATELY FOLLOWING A MEAL   [DISCONTINUED] Potassium Chloride ER 20 MEQ TBCR TAKE 1 TABLET BY MOUTH DAILY   [DISCONTINUED] telmisartan-hydrochlorothiazide (MICARDIS HCT) 80-12.5 MG tablet TAKE 1  TABLET BY MOUTH DAILY       08/18/2022    4:25 PM 07/21/2022    9:02 AM  GAD 7 : Generalized Anxiety Score  Nervous, Anxious, on Edge 0 0  Control/stop worrying 0 0  Worry too much - different things 0 0  Trouble relaxing 0 0  Restless 0 0  Easily annoyed or irritable 0 0  Afraid - awful might happen 0 0  Total GAD 7 Score 0 0  Anxiety Difficulty Not difficult at all Not difficult at all       08/18/2022    4:25 PM 07/21/2022    9:02 AM 01/05/2021    4:10 PM  Depression screen PHQ 2/9  Decreased Interest 0 0 0  Down, Depressed, Hopeless 0 0 0  PHQ - 2 Score 0 0 0  Altered sleeping 0 0   Tired, decreased energy 0 0   Change in appetite 0 0   Feeling bad or failure about yourself  0 0   Trouble concentrating 0 0   Moving slowly or fidgety/restless 0 0   Suicidal thoughts 0 0   PHQ-9 Score 0 0   Difficult doing work/chores Not difficult at all Not difficult at all     BP Readings from Last 3 Encounters:  08/18/22 118/84  07/21/22 130/78  07/06/22 125/84    Wt Readings from Last 3 Encounters:  08/18/22 284 lb (128.8 kg)  07/21/22 288 lb (130.6 kg)  12/03/21 280 lb (127 kg)    BP 118/84   Pulse 90   Temp 98.2 F (36.8 C) (Oral)   Ht 6\' 2"  (1.88 m)   Wt 284 lb (128.8 kg)   SpO2 97%   BMI 36.46 kg/m   Physical Exam Vitals and nursing note reviewed.  Eyes:     General: Lids are everted, no foreign bodies appreciated.        Left eye: Hordeolum present.    Conjunctiva/sclera:     Right eye: Right conjunctiva is injected.     Left eye: Left conjunctiva is injected.      Comments: There appears to be 2 discrete erythematous firm lesions of the LUL, both nontender and not actively draining.       Assessment and Plan:  1. Cervical radiculopathy due to trauma Much improved, continue medications as below.  Reminded to take meloxicam with a full meal. - meloxicam (MOBIC) 15 MG tablet; Take 1 tablet (15 mg total) by mouth daily. Take with a full meal to  minimize GI discomfort  Dispense: 30 tablet; Refill: 1 - gabapentin (NEURONTIN) 300 MG capsule; Take 1 capsule (300 mg total) by mouth 3 (three) times daily. Day 1 - Take once. Day 2 - Take twice. Day 3 and  after - Take up to three times daily  Dispense: 270 capsule; Refill: 0  2. Hordeolum externum left upper eyelid Hordeolum vs. Chalazion.  Mixed features.  Either way advised warm compress minimum of 3 times per day.  Consider topical antibiotic if worsened or not resolving over the next 5 days.  Patient education attached to today's visit  3. Primary hypertension Well-controlled with current medications, refill as below - metoprolol succinate (TOPROL-XL) 50 MG 24 hr tablet; TAKE 1 TABLET(50 MG) BY MOUTH DAILY WITH OR IMMEDIATELY FOLLOWING A MEAL  Dispense: 90 tablet; Refill: 1 - telmisartan-hydrochlorothiazide (MICARDIS HCT) 80-12.5 MG tablet; Take 1 tablet by mouth daily.  Dispense: 90 tablet; Refill: 1  4. Acute left-sided low back pain with left-sided sciatica Much improved, continue medications as below.  Reminded to take meloxicam with a full meal. - meloxicam (MOBIC) 15 MG tablet; Take 1 tablet (15 mg total) by mouth daily. Take with a full meal to minimize GI discomfort  Dispense: 30 tablet; Refill: 1 - gabapentin (NEURONTIN) 300 MG capsule; Take 1 capsule (300 mg total) by mouth 3 (three) times daily. Day 1 - Take once. Day 2 - Take twice. Day 3 and after - Take up to three times daily  Dispense: 270 capsule; Refill: 0  5. History of hypokalemia Discussed with patient potassium has been normal on the last few labs.  Low of 3.3 in 2021.  Patient given the option to discontinue KCl if desired.  He would prefer to continue taking this medication for the time being - Potassium Chloride ER 20 MEQ TBCR; Take 1 tablet (20 mEq total) by mouth daily.  Dispense: 90 tablet; Refill: 3  6. Prediabetes New diagnosis.  Discussed with patient no need for medication at this time.  Focus on lifestyle  modification. Recommend diet low in simple carbohydrates such as white starches (bread, pasta, rice) and refined sugar found in desserts and sweetened beverages including juice, sweet tea, and soda.    7. Mixed hyperlipidemia Chronic, mild, not needing medication at this time. I recommend reducing consumption of foods high in cholesterol especially animal fats. Lean proteins such as skinless chicken, [place], fish and nuts are encouraged. As always, regular physical activity is recommended for cholesterol metabolism.  The 10-year ASCVD risk score (Arnett DK, et al., 2019) is: 4.7%    Return in about 6 weeks (around 09/29/2022) for OV neuralgia, HTN.   Partially dictated using Animal nutritionist. Any errors are unintentional.  Alvester Morin, PA-C, DMSc, Nutritionist [facility] [phone]

## 2022-09-11 ENCOUNTER — Other Ambulatory Visit: Payer: Self-pay | Admitting: Physician Assistant

## 2022-09-11 DIAGNOSIS — M5412 Radiculopathy, cervical region: Secondary | ICD-10-CM

## 2022-09-11 DIAGNOSIS — M5431 Sciatica, right side: Secondary | ICD-10-CM

## 2022-09-11 DIAGNOSIS — M5432 Sciatica, left side: Secondary | ICD-10-CM

## 2022-09-11 NOTE — Telephone Encounter (Signed)
Please review.  KP

## 2022-09-11 NOTE — Telephone Encounter (Signed)
Pt response.  KP

## 2022-09-11 NOTE — Telephone Encounter (Signed)
Can you change his referral?

## 2022-09-13 NOTE — Telephone Encounter (Signed)
Please review.  KP

## 2022-09-27 ENCOUNTER — Ambulatory Visit
Admission: RE | Admit: 2022-09-27 | Discharge: 2022-09-27 | Disposition: A | Discharge status: Home / Self Care | Payer: PRIVATE HEALTH INSURANCE | Source: Ambulatory Visit | Attending: Physician Assistant | Admitting: Physician Assistant

## 2022-09-27 ENCOUNTER — Ambulatory Visit: Payer: No Typology Code available for payment source

## 2022-09-27 DIAGNOSIS — M5431 Sciatica, right side: Secondary | ICD-10-CM | POA: Insufficient documentation

## 2022-09-27 DIAGNOSIS — M5432 Sciatica, left side: Secondary | ICD-10-CM | POA: Insufficient documentation

## 2022-09-27 MED ORDER — GADOBUTROL 1 MMOL/ML IV SOLN
10.0000 mL | Freq: Once | INTRAVENOUS | Status: AC | PRN
  Administered 2022-09-27: 10 mL via INTRAVENOUS

## 2022-09-29 ENCOUNTER — Ambulatory Visit: Payer: No Typology Code available for payment source | Admitting: Physician Assistant

## 2022-10-05 ENCOUNTER — Other Ambulatory Visit: Payer: Self-pay | Admitting: Physician Assistant

## 2022-10-05 DIAGNOSIS — M5442 Lumbago with sciatica, left side: Secondary | ICD-10-CM

## 2022-10-05 DIAGNOSIS — M5412 Radiculopathy, cervical region: Secondary | ICD-10-CM

## 2022-10-05 NOTE — Telephone Encounter (Signed)
Requested Prescriptions  Pending Prescriptions Disp Refills   meloxicam (MOBIC) 15 MG tablet [Pharmacy Med Name: MELOXICAM 15MG  TABLETS] 90 tablet 0    Sig: TAKE 1 TABLET BY MOUTH DAILY. TAKE WITH A FULL MEAL TO MINIMIZE GI DISCOMFORT     Analgesics:  COX2 Inhibitors Failed - 10/05/2022  7:04 AM      Failed - Manual Review: Labs are only required if the patient has taken medication for more than 8 weeks.      Passed - HGB in normal range and within 360 days    Hemoglobin  Date Value Ref Range Status  07/24/2022 16.4 13.0 - 17.7 g/dL Final         Passed - Cr in normal range and within 360 days    Creat  Date Value Ref Range Status  01/03/2021 1.05 0.60 - 1.29 mg/dL Final   Creatinine, Ser  Date Value Ref Range Status  07/24/2022 1.08 0.76 - 1.27 mg/dL Final         Passed - HCT in normal range and within 360 days    Hematocrit  Date Value Ref Range Status  07/24/2022 49.8 37.5 - 51.0 % Final         Passed - AST in normal range and within 360 days    AST  Date Value Ref Range Status  07/24/2022 12 0 - 40 IU/L Final         Passed - ALT in normal range and within 360 days    ALT  Date Value Ref Range Status  07/24/2022 18 0 - 44 IU/L Final         Passed - eGFR is 30 or above and within 360 days    GFR calc Af Amer  Date Value Ref Range Status  07/11/2019 >60 >60 mL/min Final   GFR, Estimated  Date Value Ref Range Status  12/03/2021 >60 >60 mL/min Final    Comment:    (NOTE) Calculated using the CKD-EPI Creatinine Equation (2021)    eGFR  Date Value Ref Range Status  07/24/2022 84 >59 mL/min/1.73 Final         Passed - Patient is not pregnant      Passed - Valid encounter within last 12 months    Recent Outpatient Visits           1 month ago Cervical radiculopathy due to trauma   [facility] at [facility], Melton Alar, [place]   2 months ago Cervical radiculopathy due to trauma   [facility] Primary Care &  Sports Medicine at [facility], Melton Alar, [place]       Future Appointments             In 2 weeks Mordecai Maes, Melton Alar, PA [facility] at [facility], [facility]

## 2022-10-09 NOTE — Telephone Encounter (Signed)
Please advise 

## 2022-10-11 ENCOUNTER — Other Ambulatory Visit: Payer: Self-pay | Admitting: Physician Assistant

## 2022-10-11 DIAGNOSIS — M5441 Lumbago with sciatica, right side: Secondary | ICD-10-CM

## 2022-10-11 DIAGNOSIS — R202 Paresthesia of skin: Secondary | ICD-10-CM

## 2022-10-11 NOTE — Telephone Encounter (Signed)
Please review.  KP

## 2022-10-20 ENCOUNTER — Encounter: Payer: Self-pay | Admitting: Physician Assistant

## 2022-10-20 ENCOUNTER — Ambulatory Visit: Payer: No Typology Code available for payment source | Admitting: Physician Assistant

## 2022-10-20 VITALS — BP 124/82 | HR 94 | Temp 98.5°F | Ht 74.0 in | Wt 284.0 lb

## 2022-10-20 DIAGNOSIS — M5431 Sciatica, right side: Secondary | ICD-10-CM

## 2022-10-20 DIAGNOSIS — R253 Fasciculation: Secondary | ICD-10-CM | POA: Diagnosis not present

## 2022-10-20 DIAGNOSIS — I10 Essential (primary) hypertension: Secondary | ICD-10-CM | POA: Diagnosis not present

## 2022-10-20 DIAGNOSIS — M542 Cervicalgia: Secondary | ICD-10-CM | POA: Diagnosis not present

## 2022-10-20 DIAGNOSIS — M5432 Sciatica, left side: Secondary | ICD-10-CM

## 2022-10-20 NOTE — Progress Notes (Signed)
Date:  10/20/2022   Name:  Kurt Williamson   DOB:  May 21, 1970   MRN:  [id number]   Chief Complaint: Hypertension and Neurologic Problem (Neuralgia)  HPI Onalee Hua returns for 65-month follow-up on HTN and neurologic symptoms.  He continues to complain of sciatica-like symptoms bilaterally, worse on the left leg and especially the foot.  Pain is mostly well-controlled with a regimen of meloxicam daily and gabapentin 300 mg TID. Endorses muscle fasciculations of bilateral buttocks, left thigh, left foot.  He has been going to formal PT for some of his left shoulder pain/cervical radiculopathy, reports PT seemed to be improving things quite a bit until last Thursday when he thinks the provider put too much traction on the shoulder -patient endorses severe pain into the left arm in the days which followed.  Recent MRI of the lumbar spine was mostly unremarkable although it did show some mild disc bulging in some places and lumbosacral transitional anatomy.  Patient wonders about piriformis syndrome  Scheduled for consult with [facility] neuro Dr. Malvin Johns 11/01/22   Medication list has been reviewed and updated.  Current Meds  Medication Sig   FIBER PO Take by mouth.   gabapentin (NEURONTIN) 300 MG capsule Take 1 capsule (300 mg total) by mouth 3 (three) times daily. Day 1 - Take once. Day 2 - Take twice. Day 3 and after - Take up to three times daily   meloxicam (MOBIC) 15 MG tablet TAKE 1 TABLET BY MOUTH DAILY. TAKE WITH A FULL MEAL TO MINIMIZE GI DISCOMFORT   metoprolol succinate (TOPROL-XL) 50 MG 24 hr tablet TAKE 1 TABLET(50 MG) BY MOUTH DAILY WITH OR IMMEDIATELY FOLLOWING A MEAL   Multiple Vitamin (MULTIVITAMIN) tablet Take 1 tablet by mouth daily.   Omega-3 Fatty Acids (FISH OIL PO) Take by mouth.   Potassium Chloride ER 20 MEQ TBCR Take 1 tablet (20 mEq total) by mouth daily.   telmisartan-hydrochlorothiazide (MICARDIS HCT) 80-12.5 MG tablet Take 1 tablet by mouth daily.     Review of Systems   Constitutional:  Negative for fatigue and fever.  Respiratory:  Negative for chest tightness and shortness of breath.   Cardiovascular:  Negative for chest pain and palpitations.  Gastrointestinal:  Negative for abdominal pain.  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   Primary hypertension 01/05/2021    No Known Allergies  Immunization History  Administered Date(s) Administered   Tdap 01/05/2021    Past Surgical History:  Procedure Laterality Date   WISDOM TOOTH EXTRACTION      Social History   Tobacco Use   Smoking status: Never   Smokeless tobacco: Never  Vaping Use   Vaping Use: Never used  Substance Use Topics   Alcohol use: Not Currently   Drug use: Never    Family History  Problem Relation Age of Onset   Arthritis Father    Hypertension Father         10/20/2022    3:55 PM 08/18/2022    4:25 PM 07/21/2022  9:02 AM  GAD 7 : Generalized Anxiety Score  Nervous, Anxious, on Edge 0 0 0  Control/stop worrying 0 0 0  Worry too much - different things 0 0 0  Trouble relaxing 0 0 0  Restless 0 0 0  Easily annoyed or irritable 0 0 0  Afraid - awful might happen 0 0 0  Total GAD 7 Score 0 0 0  Anxiety Difficulty Not difficult at all Not difficult at all Not difficult at all       10/20/2022    3:55 PM 08/18/2022    4:25 PM 07/21/2022    9:02 AM  Depression screen PHQ 2/9  Decreased Interest 0 0 0  Down, Depressed, Hopeless 0 0 0  PHQ - 2 Score 0 0 0  Altered sleeping 0 0 0  Tired, decreased energy 0 0 0   Change in appetite 0 0 0  Feeling bad or failure about yourself  0 0 0  Trouble concentrating 0 0 0  Moving slowly or fidgety/restless 0 0 0  Suicidal thoughts 0 0 0  PHQ-9 Score 0 0 0  Difficult doing work/chores Not difficult at all Not difficult at all Not difficult at all    BP Readings from Last 3 Encounters:  10/20/22 124/82  08/18/22 118/84  07/21/22 130/78    Wt Readings from Last 3 Encounters:  10/20/22 284 lb (128.8 kg)  08/18/22 284 lb (128.8 kg)  07/21/22 288 lb (130.6 kg)    BP 124/82   Pulse 94   Temp 98.5 F (36.9 C) (Oral)   Ht 6\' 2"  (1.88 m)   Wt 284 lb (128.8 kg)   SpO2 95%   BMI 36.46 kg/m   Physical Exam Vitals and nursing note reviewed.  Constitutional:      Appearance: Normal appearance.  Cardiovascular:     Rate and Rhythm: Normal rate.  Pulmonary:     Effort: Pulmonary effort is normal.  Abdominal:     General: There is no distension.  Musculoskeletal:        General: Normal range of motion.  Skin:    General: Skin is warm and dry.  Neurological:     Mental Status: He is alert and oriented to person, place, and time.     Gait: Gait is intact.  Psychiatric:        Mood and Affect: Mood and affect normal.     Recent Labs     Component Value Date/Time   NA 141 07/24/2022 0819   K 4.4 07/24/2022 0819   CL 103 07/24/2022 0819   CO2 22 07/24/2022 0819   GLUCOSE 95 07/24/2022 0819   GLUCOSE 128 (H) 12/03/2021 0853   BUN 20 07/24/2022 0819   CREATININE 1.08 07/24/2022 0819   CREATININE 1.05 01/03/2021 0935   CALCIUM 10.3 (H) 07/24/2022 0819   PROT 7.3 07/24/2022 0819   ALBUMIN 4.6 07/24/2022 0819   AST 12 07/24/2022 0819   ALT 18 07/24/2022 0819   ALKPHOS 64 07/24/2022 0819   BILITOT 0.9 07/24/2022 0819   GFRNONAA >60 12/03/2021 0853   GFRAA >60 07/11/2019 1509    Lab Results  Component Value Date   WBC 9.5 07/24/2022   HGB 16.4 07/24/2022   HCT 49.8 07/24/2022   MCV 88 07/24/2022   PLT 238 07/24/2022   Lab Results   Component Value Date   HGBA1C 5.9 (H) 07/24/2022   Lab Results  Component Value Date   CHOL 193 07/24/2022   HDL  36 (L) 07/24/2022   LDLCALC 119 (H) 07/24/2022   TRIG 218 (H) 07/24/2022   CHOLHDL 5.4 (H) 07/24/2022   Lab Results  Component Value Date   TSH 1.140 07/24/2022     Assessment and Plan:  1. Primary hypertension Appears to be well-controlled on current regimen, continue.  Repeat CMP - Comprehensive metabolic panel  2. Fasciculations of muscle Check CMP for electrolyte abnormalities - Comprehensive metabolic panel  3. Cervicalgia Continue current regimen of meloxicam and gabapentin.  Continue PT  4. Bilateral sciatica Continue current regimen of meloxicam and gabapentin.  Advised if piriformis syndrome, treatment remains largely the same as far as I know.  I am interested to see neurology impression.   Return in about 3 months (around 01/20/2023) for OV f/u chronic conditions.   Partially dictated using Animal nutritionist. Any errors are unintentional.  Alvester Morin, PA-C, DMSc, Nutritionist [facility] [phone]

## 2022-10-21 LAB — COMPREHENSIVE METABOLIC PANEL
ALT: 24 IU/L (ref 0–44)
AST: 18 IU/L (ref 0–40)
Albumin: 5 g/dL (ref 4.1–5.1)
Alkaline Phosphatase: 95 IU/L (ref 44–121)
BUN/Creatinine Ratio: 22 — ABNORMAL HIGH (ref 9–20)
BUN: 23 mg/dL (ref 6–24)
Bilirubin Total: 0.6 mg/dL (ref 0.0–1.2)
CO2: 22 mmol/L (ref 20–29)
Calcium: 10 mg/dL (ref 8.7–10.2)
Chloride: 105 mmol/L (ref 96–106)
Creatinine, Ser: 1.04 mg/dL (ref 0.76–1.27)
Globulin, Total: 3 g/dL (ref 1.5–4.5)
Glucose: 86 mg/dL (ref 70–99)
Potassium: 4.4 mmol/L (ref 3.5–5.2)
Sodium: 141 mmol/L (ref 134–144)
Total Protein: 8 g/dL (ref 6.0–8.5)
eGFR: 87 mL/min/{1.73_m2} (ref >59)

## 2022-11-01 ENCOUNTER — Ambulatory Visit: Payer: No Typology Code available for payment source

## 2022-11-04 ENCOUNTER — Other Ambulatory Visit: Payer: Self-pay | Admitting: Physician Assistant

## 2022-11-04 DIAGNOSIS — M5412 Radiculopathy, cervical region: Secondary | ICD-10-CM

## 2022-11-04 DIAGNOSIS — M5442 Lumbago with sciatica, left side: Secondary | ICD-10-CM

## 2022-11-06 ENCOUNTER — Ambulatory Visit: Payer: No Typology Code available for payment source

## 2022-11-06 NOTE — Telephone Encounter (Signed)
Requested Prescriptions  Pending Prescriptions Disp Refills   gabapentin (NEURONTIN) 300 MG capsule [Pharmacy Med Name: GABAPENTIN 300MG  CAPSULES] 270 capsule 2    Sig: TAKE 1 CAPSULE ONCE DAILY FOR 1 DAY THEN 1 CAPSULE TWICE DAILY FOR 1 DAY THEN TAKE 1 CAPSULE UP TO THREE TIMES DAILY THEREAFTER     Neurology: Anticonvulsants - gabapentin Passed - 11/04/2022  7:11 AM      Passed - Cr in normal range and within 360 days    Creat  Date Value Ref Range Status  01/03/2021 1.05 0.60 - 1.29 mg/dL Final   Creatinine, Ser  Date Value Ref Range Status  10/20/2022 1.04 0.76 - 1.27 mg/dL Final         Passed - Completed PHQ-2 or PHQ-9 in the last 360 days      Passed - Valid encounter within last 12 months    Recent Outpatient Visits           2 weeks ago Primary hypertension   [place] [facility] at [facility], Melton Alar, [place]   2 months ago Cervical radiculopathy due to trauma   [facility] at [facility], Melton Alar, [place]   3 months ago Cervical radiculopathy due to trauma   [facility] Medicine at [facility], Melton Alar, [place]       Future Appointments             In 2 months Mordecai Maes, Melton Alar, PA [facility] at [facility], [facility]

## 2022-11-08 NOTE — Telephone Encounter (Signed)
Please review.  KP

## 2023-01-03 ENCOUNTER — Other Ambulatory Visit: Payer: Self-pay | Admitting: Physician Assistant

## 2023-01-03 DIAGNOSIS — M5412 Radiculopathy, cervical region: Secondary | ICD-10-CM

## 2023-01-03 DIAGNOSIS — M5442 Lumbago with sciatica, left side: Secondary | ICD-10-CM

## 2023-01-04 NOTE — Telephone Encounter (Signed)
Requested Prescriptions  Pending Prescriptions Disp Refills   meloxicam (MOBIC) 15 MG tablet [Pharmacy Med Name: MELOXICAM 15MG  TABLETS] 90 tablet 0    Sig: TAKE 1 TABLET BY MOUTH DAILY     Analgesics:  COX2 Inhibitors Failed - 01/03/2023  7:05 AM      Failed - Manual Review: Labs are only required if the patient has taken medication for more than 8 weeks.      Passed - HGB in normal range and within 360 days    Hemoglobin  Date Value Ref Range Status  07/24/2022 16.4 13.0 - 17.7 g/dL Final         Passed - Cr in normal range and within 360 days    Creat  Date Value Ref Range Status  01/03/2021 1.05 0.60 - 1.29 mg/dL Final   Creatinine, Ser  Date Value Ref Range Status  10/20/2022 1.04 0.76 - 1.27 mg/dL Final         Passed - HCT in normal range and within 360 days    Hematocrit  Date Value Ref Range Status  07/24/2022 49.8 37.5 - 51.0 % Final         Passed - AST in normal range and within 360 days    AST  Date Value Ref Range Status  10/20/2022 18 0 - 40 IU/L Final         Passed - ALT in normal range and within 360 days    ALT  Date Value Ref Range Status  10/20/2022 24 0 - 44 IU/L Final         Passed - eGFR is 30 or above and within 360 days    GFR calc Af Amer  Date Value Ref Range Status  07/11/2019 >60 >60 mL/min Final   GFR, Estimated  Date Value Ref Range Status  12/03/2021 >60 >60 mL/min Final    Comment:    (NOTE) Calculated using the CKD-EPI Creatinine Equation (2021)    eGFR  Date Value Ref Range Status  10/20/2022 87 >59 mL/min/1.73 Final         Passed - Patient is not pregnant      Passed - Valid encounter within last 12 months    Recent Outpatient Visits           2 months ago Primary hypertension   [place] [facility] at [facility], Melton Alar, [place]   4 months ago Cervical radiculopathy due to trauma   [facility] at [facility], Melton Alar, [place]   5 months  ago Cervical radiculopathy due to trauma   [facility] & [facility] Medicine at [facility], Melton Alar, [place]       Future Appointments             In 2 weeks Mordecai Maes, Melton Alar, PA [facility] & [facility] Medicine at [facility], [facility]

## 2023-01-12 ENCOUNTER — Other Ambulatory Visit: Payer: Self-pay | Admitting: Neurology

## 2023-01-12 DIAGNOSIS — R202 Paresthesia of skin: Secondary | ICD-10-CM

## 2023-01-12 DIAGNOSIS — M542 Cervicalgia: Secondary | ICD-10-CM

## 2023-01-15 NOTE — Telephone Encounter (Signed)
FYI  KP

## 2023-01-19 ENCOUNTER — Encounter: Payer: Self-pay | Admitting: Neurology

## 2023-01-24 ENCOUNTER — Ambulatory Visit: Payer: No Typology Code available for payment source | Admitting: Physician Assistant

## 2023-01-26 ENCOUNTER — Other Ambulatory Visit: Payer: PRIVATE HEALTH INSURANCE

## 2023-01-30 ENCOUNTER — Other Ambulatory Visit: Payer: Self-pay | Admitting: Physician Assistant

## 2023-01-30 DIAGNOSIS — M5412 Radiculopathy, cervical region: Secondary | ICD-10-CM

## 2023-01-30 DIAGNOSIS — M5442 Lumbago with sciatica, left side: Secondary | ICD-10-CM

## 2023-01-30 MED ORDER — GABAPENTIN 300 MG PO CAPS: ORAL_CAPSULE | ORAL | Status: AC

## 2023-01-30 NOTE — Telephone Encounter (Signed)
Please review.  KP

## 2023-02-02 ENCOUNTER — Other Ambulatory Visit: Payer: Self-pay | Admitting: Physician Assistant

## 2023-02-02 DIAGNOSIS — I10 Essential (primary) hypertension: Secondary | ICD-10-CM

## 2023-02-16 ENCOUNTER — Ambulatory Visit
Admission: RE | Admit: 2023-02-16 | Discharge: 2023-02-16 | Disposition: A | Discharge status: Home / Self Care | Payer: No Typology Code available for payment source | Source: Ambulatory Visit | Attending: Neurology | Admitting: Neurology

## 2023-02-16 DIAGNOSIS — R202 Paresthesia of skin: Secondary | ICD-10-CM

## 2023-02-26 ENCOUNTER — Other Ambulatory Visit: Payer: Self-pay | Admitting: Physician Assistant

## 2023-02-26 DIAGNOSIS — M5412 Radiculopathy, cervical region: Secondary | ICD-10-CM

## 2023-02-26 DIAGNOSIS — M5441 Lumbago with sciatica, right side: Secondary | ICD-10-CM

## 2023-06-19 ENCOUNTER — Ambulatory Visit: Payer: No Typology Code available for payment source | Admitting: Diagnostic Neuroimaging

## 2023-07-23 ENCOUNTER — Other Ambulatory Visit: Payer: Self-pay | Admitting: Physician Assistant

## 2023-07-23 DIAGNOSIS — Z8639 Personal history of other endocrine, nutritional and metabolic disease: Secondary | ICD-10-CM

## 2023-07-23 DIAGNOSIS — I10 Essential (primary) hypertension: Secondary | ICD-10-CM

## 2023-07-23 MED ORDER — METOPROLOL SUCCINATE ER 50 MG PO TB24: ORAL_TABLET | ORAL | 1 refills | Status: DC

## 2023-07-23 MED ORDER — POTASSIUM CHLORIDE ER 20 MEQ PO TBCR
1.0000 | EXTENDED_RELEASE_TABLET | Freq: Every day | ORAL | 1 refills | Status: DC
Start: 2023-07-23 — End: 2023-12-13

## 2023-07-23 NOTE — Telephone Encounter (Signed)
 Please review.  KP

## 2023-07-25 ENCOUNTER — Other Ambulatory Visit: Payer: Self-pay | Admitting: Physical Medicine & Rehabilitation

## 2023-07-25 DIAGNOSIS — M5412 Radiculopathy, cervical region: Secondary | ICD-10-CM

## 2023-07-25 DIAGNOSIS — M5414 Radiculopathy, thoracic region: Secondary | ICD-10-CM

## 2023-07-28 ENCOUNTER — Ambulatory Visit
Admission: RE | Admit: 2023-07-28 | Discharge: 2023-07-28 | Disposition: A | Discharge status: Home / Self Care | Source: Ambulatory Visit | Attending: Physical Medicine & Rehabilitation | Admitting: Physical Medicine & Rehabilitation

## 2023-07-28 DIAGNOSIS — M5412 Radiculopathy, cervical region: Secondary | ICD-10-CM

## 2023-07-28 DIAGNOSIS — M5414 Radiculopathy, thoracic region: Secondary | ICD-10-CM

## 2023-08-11 ENCOUNTER — Other Ambulatory Visit: Payer: Self-pay | Admitting: Physician Assistant

## 2023-08-11 DIAGNOSIS — I10 Essential (primary) hypertension: Secondary | ICD-10-CM

## 2023-08-13 NOTE — Telephone Encounter (Signed)
 Too soon for refill, last refill 07/23/23 for 90 and 1 refill.  Requested Prescriptions  Pending Prescriptions Disp Refills   metoprolol  succinate (TOPROL -XL) 50 MG 24 hr tablet [Pharmacy Med Name: METOPROLOL  ER SUCCINATE 50MG  TABS] 90 tablet 1    Sig: TAKE 1 TABLET(50 MG) BY MOUTH DAILY WITH OR IMMEDIATELY FOLLOWING A MEAL     Cardiovascular:  Beta Blockers Failed - 08/13/2023 10:57 AM      Failed - Valid encounter within last 6 months    Recent Outpatient Visits   None            Passed - Last BP in normal range    BP Readings from Last 1 Encounters:  10/20/22 124/82         Passed - Last Heart Rate in normal range    Pulse Readings from Last 1 Encounters:  10/20/22 94

## 2023-11-07 ENCOUNTER — Other Ambulatory Visit: Payer: Self-pay | Admitting: Physician Assistant

## 2023-11-07 DIAGNOSIS — I10 Essential (primary) hypertension: Secondary | ICD-10-CM

## 2023-11-08 NOTE — Telephone Encounter (Signed)
 Too soon for refill, refilled on 07/23/23 for 90 and 1 RF.  Requested Prescriptions  Pending Prescriptions Disp Refills   metoprolol  succinate (TOPROL -XL) 50 MG 24 hr tablet [Pharmacy Med Name: METOPROLOL  ER SUCCINATE 50MG  TABS] 90 tablet 1    Sig: TAKE 1 TABLET(50 MG) BY MOUTH DAILY WITH OR IMMEDIATELY FOLLOWING A MEAL     Cardiovascular:  Beta Blockers Failed - 11/08/2023  1:43 PM      Failed - Valid encounter within last 6 months    Recent Outpatient Visits   None            Passed - Last BP in normal range    BP Readings from Last 1 Encounters:  10/20/22 124/82         Passed - Last Heart Rate in normal range    Pulse Readings from Last 1 Encounters:  10/20/22 94

## 2023-12-05 ENCOUNTER — Ambulatory Visit

## 2023-12-05 ENCOUNTER — Other Ambulatory Visit: Payer: Self-pay

## 2023-12-13 ENCOUNTER — Ambulatory Visit (INDEPENDENT_AMBULATORY_CARE_PROVIDER_SITE_OTHER): Admitting: Physician Assistant

## 2023-12-13 VITALS — BP 128/88 | HR 107 | Temp 98.8°F | Ht 74.0 in | Wt 298.0 lb

## 2023-12-13 DIAGNOSIS — Z0000 Encounter for general adult medical examination without abnormal findings: Secondary | ICD-10-CM

## 2023-12-13 DIAGNOSIS — I10 Essential (primary) hypertension: Secondary | ICD-10-CM

## 2023-12-13 DIAGNOSIS — R1011 Right upper quadrant pain: Secondary | ICD-10-CM | POA: Diagnosis not present

## 2023-12-13 DIAGNOSIS — Z125 Encounter for screening for malignant neoplasm of prostate: Secondary | ICD-10-CM

## 2023-12-13 DIAGNOSIS — R7303 Prediabetes: Secondary | ICD-10-CM

## 2023-12-13 DIAGNOSIS — Z8639 Personal history of other endocrine, nutritional and metabolic disease: Secondary | ICD-10-CM

## 2023-12-13 DIAGNOSIS — Z1211 Encounter for screening for malignant neoplasm of colon: Secondary | ICD-10-CM

## 2023-12-13 DIAGNOSIS — E66812 Obesity, class 2: Secondary | ICD-10-CM | POA: Diagnosis not present

## 2023-12-13 DIAGNOSIS — Z6838 Body mass index (BMI) 38.0-38.9, adult: Secondary | ICD-10-CM

## 2023-12-13 DIAGNOSIS — Z1159 Encounter for screening for other viral diseases: Secondary | ICD-10-CM

## 2023-12-13 DIAGNOSIS — Z114 Encounter for screening for human immunodeficiency virus [HIV]: Secondary | ICD-10-CM

## 2023-12-13 DIAGNOSIS — E782 Mixed hyperlipidemia: Secondary | ICD-10-CM

## 2023-12-13 DIAGNOSIS — L989 Disorder of the skin and subcutaneous tissue, unspecified: Secondary | ICD-10-CM

## 2023-12-13 MED ORDER — TELMISARTAN-HCTZ 80-12.5 MG PO TABS: 1.0000 | ORAL_TABLET | Freq: Every day | ORAL | 3 refills | Status: AC

## 2023-12-13 MED ORDER — FAMOTIDINE 20 MG PO TABS
20.0000 mg | ORAL_TABLET | Freq: Two times a day (BID) | ORAL | 1 refills | Status: DC | PRN
Start: 2023-12-13 — End: 2024-02-28

## 2023-12-13 MED ORDER — POTASSIUM CHLORIDE ER 20 MEQ PO TBCR
1.0000 | EXTENDED_RELEASE_TABLET | Freq: Every day | ORAL | 3 refills | Status: AC
Start: 2023-12-13 — End: ?

## 2023-12-13 MED ORDER — METOPROLOL SUCCINATE ER 50 MG PO TB24
ORAL_TABLET | ORAL | 3 refills | Status: AC
Start: 2023-12-13 — End: ?

## 2023-12-13 NOTE — Progress Notes (Signed)
 Date:  12/13/2023   Name:  Kurt Williamson   DOB:  10-May-1970   MRN:  [id number]   Chief Complaint: Annual Exam and Abdominal Pain (X6 weeks,Right upper area, sharp pain, has gas and indigestion, has got better, feels like pressure and tightness, omeprazole is not helping, normal bowels )  HPI Alm presents today for routine physical exam. Since Apr 2024 he has been struggling with nerve pain and sciatica induced from a spinal injury when his dog pulled the leash in an awkward direction. Thankfully, after much imaging, PT, injections, and specialist evaluation, he has achieved some relief and will return PRN to Dr. Dodson (physical med) if any further intervention is needed for this. He does still mention a persistent sensation between the left toes like a clothespin which is always present.   Unfortunately he has gained about 14 pounds in the last year, likely due in part to use of gabapentin .    Last Physical: 2y ago Last Dental Exam: 5y ago Last Eye Exam: 5y ago Last CRC screen: Never Last PSA: 01/03/21 = 0.60 Immunizations Due: Shingrix  He does mention episodic RUQ abdominal pain for the last 6 weeks, might be gradually improving but most recent episode was Monday after eating a steak.  This is the first episode was so painful that he almost chose to go to the emergency room.  The first episode was associated with nausea, but others have not been.  No vomiting, no bowel changes.  Has not used meloxicam  in about a year, and denies frequent use of other NSAIDs. Pepto Bismol helps, but omeprazole does not do much for him when this occurs.   Mentions a small bump on the penile shaft for about 3 weeks which occurred only after nicking himself while shaving. He says a chunk of his skin got caught in the clipper. Afterwards, a scab formed and fell off, but he has been left with a pink bump that does not seem to be changing. No known history of genital warts/HPV. Stable male partner though  not sexually active at present. Denies any new sexual partners.   Medication list has been reviewed and updated.  Current Meds  Medication Sig   FIBER PO Take by mouth.   gabapentin  (NEURONTIN ) 300 MG capsule Take 1 capsule in the morning, 1 capsule in the afternoon, and 3 capsules at bedtime. Total 1500 mg daily.   metoprolol  succinate (TOPROL -XL) 50 MG 24 hr tablet TAKE 1 TABLET(50 MG) BY MOUTH DAILY WITH OR IMMEDIATELY FOLLOWING A MEAL   Multiple Vitamin (MULTIVITAMIN) tablet Take 1 tablet by mouth daily.   Omega-3 Fatty Acids (FISH OIL PO) Take by mouth.   omeprazole (PRILOSEC) 20 MG capsule Take 20 mg by mouth.   Potassium Chloride  ER 20 MEQ TBCR Take 1 tablet (20 mEq total) by mouth daily.   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   Primary hypertension 01/05/2021    No Known Allergies  Immunization History  Administered Date(s)  Administered   Tdap 01/05/2021    Past Surgical History:  Procedure Laterality Date   WISDOM TOOTH EXTRACTION      Social History   Tobacco Use   Smoking status: Never   Smokeless tobacco: Never  Vaping Use   Vaping status: Never Used  Substance Use Topics   Alcohol use: Not Currently   Drug use: Never    Family History  Problem Relation Age of Onset   Arthritis Father    Hypertension Father         12/13/2023    3:54 PM 10/20/2022    3:55 PM 08/18/2022    4:25 PM 07/21/2022    9:02 AM  GAD 7 : Generalized Anxiety Score   Nervous, Anxious, on Edge 0 0 0 0  Control/stop worrying 0 0 0 0  Worry too much - different things 0 0 0 0  Trouble relaxing 0 0 0 0  Restless 0 0 0 0  Easily annoyed or irritable 0 0 0 0  Afraid - awful might happen 0 0 0 0  Total GAD 7 Score 0 0 0 0  Anxiety Difficulty Not difficult at all Not difficult at all Not difficult at all Not difficult at all       12/13/2023    3:54 PM 10/20/2022    3:55 PM 08/18/2022    4:25 PM  Depression screen PHQ 2/9  Decreased Interest 0 0 0  Down, Depressed, Hopeless 0 0 0  PHQ - 2 Score 0 0 0  Altered sleeping  0 0  Tired, decreased energy  0 0  Change in appetite  0 0  Feeling bad or failure about yourself   0 0  Trouble concentrating  0 0  Moving slowly or fidgety/restless  0 0  Suicidal thoughts  0 0  PHQ-9 Score  0 0  Difficult doing work/chores  Not difficult at all Not difficult at all    BP Readings from Last 3 Encounters:  12/13/23 128/88  10/20/22 124/82  08/18/22 118/84    Wt Readings from Last 3 Encounters:  12/13/23 298 lb (135.2 kg)  10/20/22 284 lb (128.8 kg)  08/18/22 284 lb (128.8 kg)    BP 128/88   Pulse (!) 107   Temp 98.8 F (37.1 C)   Ht 6' 2 (1.88 m)   Wt 298 lb (135.2 kg)   SpO2 97%   BMI 38.26 kg/m   Physical Exam Vitals and nursing note reviewed.  Constitutional:      Appearance: Normal appearance.  HENT:     Ears:     Comments: EAC clear bilaterally with good view of TM which is without effusion or erythema.     Nose: Nose normal.     Mouth/Throat:     Mouth: Mucous membranes are moist. No oral lesions.     Dentition: Normal dentition.     Pharynx: No posterior oropharyngeal erythema.  Eyes:     Extraocular Movements: Extraocular movements intact.     Conjunctiva/sclera: Conjunctivae normal.     Pupils: Pupils are equal, round, and reactive to light.  Neck:     Thyroid: No thyromegaly.     Vascular: No carotid bruit.  Cardiovascular:     Rate and Rhythm: Normal rate and regular  rhythm.     Heart sounds: No murmur heard.    No friction rub. No gallop.     Comments: Pulses 2+ at radial, PT, DP bilaterally. No carotid bruit. No peripheral edema  Pulmonary:     Effort: Pulmonary effort is normal.     Breath sounds: Normal breath sounds.  Abdominal:     General: Bowel sounds are normal.     Palpations: Abdomen is soft. There is no mass.     Tenderness: There is no abdominal tenderness.     Hernia: No hernia is present.  Genitourinary:    Penis: Lesions (isolated 3mm pink round papule of right base of penis) present.      Testes: Normal.        Right: Mass not present.        Left: Mass not present.     Prostate: Normal. Not enlarged, not tender and no nodules present.     Rectum: Normal. Guaiac result negative. No mass.  Musculoskeletal:     Comments: Full ROM with strength 5/5 bilateral upper and lower extremities  Lymphadenopathy:     Cervical: No cervical adenopathy.  Skin:    General: Skin is warm.     Capillary Refill: Capillary refill takes less than 2 seconds.     Findings: No lesion or rash.  Neurological:     Mental Status: He is alert and oriented to person, place, and time.     Gait: Gait is intact.  Psychiatric:        Mood and Affect: Mood normal.        Behavior: Behavior normal.     Recent Labs     Component Value Date/Time   NA 141 10/20/2022 1638   K 4.4 10/20/2022 1638   CL 105 10/20/2022 1638   CO2 22 10/20/2022 1638   GLUCOSE 86 10/20/2022 1638   GLUCOSE 128 (H) 12/03/2021 0853   BUN 23 10/20/2022 1638   CREATININE 1.04 10/20/2022 1638   CREATININE 1.05 01/03/2021 0935   CALCIUM 10.0 10/20/2022 1638   PROT 8.0 10/20/2022 1638   ALBUMIN 5.0 10/20/2022 1638   AST 18 10/20/2022 1638   ALT 24 10/20/2022 1638   ALKPHOS 95 10/20/2022 1638   BILITOT 0.6 10/20/2022 1638   GFRNONAA >60 12/03/2021 0853   GFRAA >60 07/11/2019 1509    Lab Results  Component Value Date   WBC 9.5 07/24/2022   HGB 16.4 07/24/2022   HCT 49.8  07/24/2022   MCV 88 07/24/2022   PLT 238 07/24/2022   Lab Results  Component Value Date   HGBA1C 5.9 (H) 07/24/2022   HGBA1C 5.3 11/17/2016   Lab Results  Component Value Date   CHOL 193 07/24/2022   HDL 36 (L) 07/24/2022   LDLCALC 119 (H) 07/24/2022   TRIG 218 (H) 07/24/2022   CHOLHDL 5.4 (H) 07/24/2022   Lab Results  Component Value Date   TSH 1.140 07/24/2022      Assessment and Plan:  1. Annual physical exam (Primary) Encouraged healthy lifestyle including regular physical activity and consumption of whole fruits and vegetables. Encouraged routine dental and eye exams.  Due for Shingrix, but deferred today due to ongoing abdominal pain.  Patient to return for fasting labs at a later date.   - Lipid panel - PSA - CBC with Differential/Platelet - Comprehensive metabolic panel with GFR - TSH  2. Colicky RUQ abdominal pain Discussed several possible etiologies including GERD flare, peptic ulcer disease, cholelithiasis.  Less likely alpha gal or other food allergy.  Today's exam was very benign.  For now, add famotidine  and obtain RUQ ultrasound.  - US  Abdomen Limited RUQ (LIVER/GB); Future - famotidine  (PEPCID ) 20 MG tablet; Take 1  tablet (20 mg total) by mouth 2 (two) times daily as needed for heartburn or indigestion.  Dispense: 90 tablet; Refill: 1  3. Skin lesion of penile shaft Suspect viral wart vs peculiar scar tissue. Isolated and well-defined unlike classic cauliflower genital warts. Monitor for additional 2-3 weeks and patient to update me via MyChart. May refer to derm.   4. Class 2 severe obesity with serious comorbidity and body mass index (BMI) of 38.0 to 38.9 in adult, unspecified obesity type (HCC) Check for metabolic comorbidities. Might consider decreasing gabapentin  dose or perhaps switching to pregabalin, though I am not sure that would be much better as far as obesogenicity.   - Lipid panel - Hemoglobin A1c - CBC with Differential/Platelet -  Comprehensive metabolic panel with GFR - TSH  5. Primary hypertension Refill antihypertensives and check routine labs  - Lipid panel - CBC with Differential/Platelet - Comprehensive metabolic panel with GFR - TSH - telmisartan -hydrochlorothiazide (MICARDIS  HCT) 80-12.5 MG tablet; Take 1 tablet by mouth daily.  Dispense: 90 tablet; Refill: 3 - metoprolol  succinate (TOPROL -XL) 50 MG 24 hr tablet; TAKE 1 TABLET(50 MG) BY MOUTH DAILY WITH OR IMMEDIATELY FOLLOWING A MEAL  Dispense: 90 tablet; Refill: 3  6. Prediabetes - Hemoglobin A1c  7. Mixed hyperlipidemia Patient to return for fasting lipids - Lipid panel  8. Screening PSA (prostate specific antigen) Prostate exam normal today, follow with PSA - PSA  9. Need for hepatitis C screening test - Hepatitis C antibody  10. Screening for HIV (human immunodeficiency virus) - HIV Antibody (routine testing w rflx)  11. History of hypokalemia - Potassium Chloride  ER 20 MEQ TBCR; Take 1 tablet (20 mEq total) by mouth daily.  Dispense: 90 tablet; Refill: 3  12. Screening for colon cancer Discussed colonoscopy versus Cologuard.  Because he is late to start screening and also may be seeing GI for abdominal pain/endoscopy  at a later date, through shared decision making we will be proceeding with referral for colonoscopy. - Ambulatory referral to Gastroenterology    F/u in 6 mo OV chronic conditions. F/u in 1y CPE   Rolan Hoyle, PA-C, DMSc, Nutritionist [facility] [phone]

## 2023-12-15 LAB — CBC WITH DIFFERENTIAL/PLATELET
Basophils Absolute: 0.1 x10E3/uL (ref 0.0–0.2)
Basos: 1 %
EOS (ABSOLUTE): 0.3 x10E3/uL (ref 0.0–0.4)
Eos: 4 %
Hematocrit: 45.6 % (ref 37.5–51.0)
Hemoglobin: 15.5 g/dL (ref 13.0–17.7)
Immature Grans (Abs): 0 x10E3/uL (ref 0.0–0.1)
Immature Granulocytes: 0 %
Lymphocytes Absolute: 2.8 x10E3/uL (ref 0.7–3.1)
Lymphs: 35 %
MCH: 30.5 pg (ref 26.6–33.0)
MCHC: 34 g/dL (ref 31.5–35.7)
MCV: 90 fL (ref 79–97)
Monocytes Absolute: 0.6 x10E3/uL (ref 0.1–0.9)
Monocytes: 8 %
Neutrophils Absolute: 4.2 x10E3/uL (ref 1.4–7.0)
Neutrophils: 52 %
Platelets: 216 x10E3/uL (ref 150–450)
RBC: 5.09 x10E6/uL (ref 4.14–5.80)
RDW: 12.7 % (ref 11.6–15.4)
WBC: 8 x10E3/uL (ref 3.4–10.8)

## 2023-12-15 LAB — LIPID PANEL
Chol/HDL Ratio: 6.2 ratio — ABNORMAL HIGH (ref 0.0–5.0)
Cholesterol, Total: 186 mg/dL (ref 100–199)
HDL: 30 mg/dL — ABNORMAL LOW (ref >39)
LDL Chol Calc (NIH): 128 mg/dL — ABNORMAL HIGH (ref 0–99)
Triglycerides: 157 mg/dL — ABNORMAL HIGH (ref 0–149)
VLDL Cholesterol Cal: 28 mg/dL (ref 5–40)

## 2023-12-15 LAB — HIV ANTIBODY (ROUTINE TESTING W REFLEX): HIV Screen 4th Generation wRfx: NONREACTIVE

## 2023-12-15 LAB — COMPREHENSIVE METABOLIC PANEL WITH GFR
ALT: 21 IU/L (ref 0–44)
AST: 19 IU/L (ref 0–40)
Albumin: 4.6 g/dL (ref 3.8–4.9)
Alkaline Phosphatase: 82 IU/L (ref 44–121)
BUN/Creatinine Ratio: 13 (ref 9–20)
BUN: 17 mg/dL (ref 6–24)
Bilirubin Total: 0.9 mg/dL (ref 0.0–1.2)
CO2: 22 mmol/L (ref 20–29)
Calcium: 9.5 mg/dL (ref 8.7–10.2)
Chloride: 102 mmol/L (ref 96–106)
Creatinine, Ser: 1.29 mg/dL — ABNORMAL HIGH (ref 0.76–1.27)
Globulin, Total: 2.8 g/dL (ref 1.5–4.5)
Glucose: 86 mg/dL (ref 70–99)
Potassium: 4.5 mmol/L (ref 3.5–5.2)
Sodium: 139 mmol/L (ref 134–144)
Total Protein: 7.4 g/dL (ref 6.0–8.5)
eGFR: 67 mL/min/1.73 (ref >59)

## 2023-12-15 LAB — PSA: Prostate Specific Ag, Serum: 1 ng/mL (ref 0.0–4.0)

## 2023-12-15 LAB — HEMOGLOBIN A1C
Est. average glucose Bld gHb Est-mCnc: 111 mg/dL
Hgb A1c MFr Bld: 5.5 % (ref 4.8–5.6)

## 2023-12-15 LAB — HEPATITIS C ANTIBODY: Hep C Virus Ab: NONREACTIVE

## 2023-12-15 LAB — TSH: TSH: 1.34 u[IU]/mL (ref 0.450–4.500)

## 2023-12-17 ENCOUNTER — Ambulatory Visit: Payer: Self-pay | Admitting: Physician Assistant

## 2023-12-18 ENCOUNTER — Ambulatory Visit
Admission: RE | Admit: 2023-12-18 | Discharge: 2023-12-18 | Disposition: A | Discharge status: Home / Self Care | Source: Ambulatory Visit | Attending: Physician Assistant | Admitting: Physician Assistant

## 2023-12-18 DIAGNOSIS — R1011 Right upper quadrant pain: Secondary | ICD-10-CM | POA: Insufficient documentation

## 2023-12-18 NOTE — Telephone Encounter (Signed)
 FYI  KP

## 2023-12-20 ENCOUNTER — Ambulatory Visit

## 2023-12-20 DIAGNOSIS — N489 Disorder of penis, unspecified: Secondary | ICD-10-CM

## 2023-12-20 NOTE — Progress Notes (Signed)
 [facility] Department STI clinic [address], [address] [place] [address] Main phone: [phone]  STI screening visit  Subjective:  Kurt Williamson is a 53 y.o. male being seen today for an STI concern.  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 He reports he was grooming a few weeks ago and injured the side of his penis with the clipper. A few days later, he accidentally scrubbed the scab off. He has had a small growth appear on the site and his PCP suggested it could be genital warts versus scarring. He would like a second opinion.  See flowsheet for further details and programmatic requirements  Hyperlink available at the top of the signed note in blue.  Flow sheet content below:  Pregnancy Intention Screening Does the patient want to become pregnant in the next year?: No Does the patient's partner want to become pregnant in the next year?: No Would the patient like to discuss contraceptive options today?: N/A Counseling Patient counseled to use condoms with all sex: Condoms declined RTC in 2-3 weeks for test results: Yes Clinic will call if test results abnormal before test result appt.: Yes Test results given to patient Patient counseled to use condoms with all sex: Condoms declined  Screening for MPX risk: Does the patient have an unexplained rash? No Is the patient  MSM? No Does the patient endorse multiple sex partners or anonymous sex partners? No Did the patient have close or sexual contact with a person diagnosed with MPX? No Has the patient traveled outside the [place]  where MPX is endemic? No Is there a high clinical suspicion for MPX-- evidenced by one of the following No  -Unlikely to be chickenpox  -Lymphadenopathy  -Rash that present in same phase of evolution on any given body part  STI screening history: Last HIV test per patient/review of record was No results found for: HMHIVSCREEN  Lab Results  Component Value Date   HIV Non Reactive 12/14/2023    Last HEPC test per patient/review of record was No results found for: HMHEPCSCREEN No components found for: HEPC   Last HEPB test per patient/review of record was No components found for: HMHEPBSCREEN   Fertility: Does the patient or their partner desires a pregnancy in the next year? No  Immunization History  Administered Date(s) Administered   Tdap 01/05/2021    The following portions of the patient's history were reviewed and updated as appropriate: allergies, current medications, past medical history, past social history, past surgical history and problem list.  Objective:  There were no vitals filed for this visit.  Physical Exam Exam conducted with a chaperone present Kurt Williamson).  Constitutional:      Appearance: Normal appearance.  HENT:     Head: Normocephalic.     Mouth/Throat:     Mouth: Mucous membranes are moist.  Eyes:     General: No scleral icterus.  Right eye: No discharge.        Left eye: No discharge.  Pulmonary:     Effort: Pulmonary effort is normal.  Genitourinary:    Penis: Circumcised.        Comments: Red dot represents small, raised, pearly pink tissue arising from site of injury. Not cauliflower-appearance, smooth and shiny. Not sore, itchy or uncomfortable. Skin:    General: Skin is warm and dry.  Neurological:     General: No  focal deficit present.     Mental Status: He is alert.  Psychiatric:        Mood and Affect: Mood normal.        Behavior: Behavior normal.    Assessment and Plan:  Kurt Williamson is a 53 y.o. male presenting to the [facility] Department for STI screening  1. Penile lesion (Primary)  - Area on shaft of penis likely a small keloid-type scar from clipper injury - Not cauliflower-type, not dry or flesh colored. Pink and pearly, non tender. No drainage, bleeding, erythema, signs of infection. - Discussed this does not appear to be a genital wart or STI related lesion - Patient has not had sexual intercourse in many years, only sexual contact in last ~15 years was a mutual masturbation/kissing encounter - Patient declines testing for gonorrhea/chlamydia today. Had HIV testing at PCP. - He has a dermatology referral/scheduled visit  Return if symptoms worsen or fail to improve.  Future Appointments  Date Time Provider Department Center  06/16/2024  2:40 PM Manya Toribio [place], [place] [facility] [address]  12/15/2024  2:40 PM Manya, Toribio [place], [place] [facility] [phone] [address]    Kurt FORBES Satchel, NP

## 2023-12-20 NOTE — Progress Notes (Signed)
 Pt is here for an examination today. All pt questions answered and condoms declined. Wilkie Drought, RN.

## 2023-12-27 ENCOUNTER — Encounter: Admitting: Physician Assistant

## 2023-12-28 ENCOUNTER — Telehealth: Payer: Self-pay

## 2023-12-28 NOTE — Telephone Encounter (Signed)
 Please schedule an appointment for this ot with GI.  KP

## 2024-01-09 NOTE — Telephone Encounter (Signed)
 Please send referral to another location.  KP

## 2024-01-14 NOTE — Telephone Encounter (Signed)
 Please review.  KP

## 2024-01-17 ENCOUNTER — Ambulatory Visit (INDEPENDENT_AMBULATORY_CARE_PROVIDER_SITE_OTHER)

## 2024-01-17 DIAGNOSIS — D489 Neoplasm of uncertain behavior, unspecified: Secondary | ICD-10-CM

## 2024-01-17 DIAGNOSIS — L0591 Pilonidal cyst without abscess: Secondary | ICD-10-CM | POA: Diagnosis not present

## 2024-01-17 DIAGNOSIS — B079 Viral wart, unspecified: Secondary | ICD-10-CM | POA: Diagnosis not present

## 2024-01-17 MED ORDER — DOXYCYCLINE MONOHYDRATE 100 MG PO TABS: ORAL_TABLET | ORAL | 2 refills | Status: DC

## 2024-01-17 NOTE — Progress Notes (Signed)
 Subjective   Kurt Williamson is a 53 y.o. male who presents for the following: Lesion(s) of concern . Patient is new patient  Today patient reports: Patient reports Top of buttock has a cyst like bump  4 to 5 months will recur every 6 weeks, pimple like bump, has been draining.  Has some scarring.   Patient also reports he was doing landscaping with clippers he injured him self at scrotum / groin . Reports has improved but still would like checked    Review of Systems:    No other skin or systemic complaints except as noted in HPI or Assessment and Plan.  The following portions of the chart were reviewed this encounter and updated as appropriate: medications, allergies, medical history  Relevant Medical History:  Reviewed   Objective  Well appearing patient in no apparent distress; mood and affect are within normal limits. Examination was performed of the: Focused Exam of: buttock, genitalia     Examination notable for: superior gluteal cleft with firm papule  R penile shaft with 5mm pink papule  Examination limited by: Clothing  right penile shaft 5 mm pink papule     Assessment & Plan   Recurrent draining lesion superior gluteal cleft - favor pilonidal cyst  - Explained to patient this most likely is consistent with an epidermal inclusion cyst, which represents trapped hair follicule and skin cells under the skin - Benign, patient reassured - Referred to general surgery to discuss excision - Start doxycycline  100 mg BID x 14 days for flares  - Discussed side effects and precautions with doxycycline  including taking with meal, waiting at least 30 minutes before lying down at night, increased sun sensitivity, and to stop medication if becomes pregnant or breastfeeding  Lesion of R penile shaft - scar vs condyloma vs nevus vs other  - biopsy as per below   Procedures, orders, diagnosis for this visit:  NEOPLASM OF UNCERTAIN BEHAVIOR right penile shaft Epidermal /  dermal shaving  Lesion diameter (cm):  0.5 Informed consent: discussed and consent obtained   Timeout: patient name, date of birth, surgical site, and procedure verified   Procedure prep:  Patient was prepped and draped in usual sterile fashion Prep type:  Isopropyl alcohol Anesthesia: the lesion was anesthetized in a standard fashion   Anesthetic:  1% lidocaine w/ epinephrine 1-100,000 buffered w/ 8.4% NaHCO3 Instrument used: flexible razor blade   Hemostasis achieved with: pressure, aluminum chloride and electrodesiccation   Outcome: patient tolerated procedure well   Post-procedure details: sterile dressing applied and wound care instructions given   Dressing type: bandage and petrolatum    Specimen 1 - Surgical pathology Differential Diagnosis: R/o hypertrophic scar vs nevus vs wart vs other   Check Margins: No Benign. Observe  R/o hypertrophic scar vs nevus vs wart vs other   Discussed biopsy to determine  PILONIDAL CYST   Related Procedures Ambulatory referral to General Surgery  Pilonidal cyst -     Ambulatory referral to General Surgery  Neoplasm of uncertain behavior -     Epidermal / dermal shaving -     Surgical pathology; Standing  Other orders -     Doxycycline  Monohydrate; Take 100 mg twice daily for 14 days when flared for pilonidal cyst. Take food and drink.  Dispense: 28 tablet; Refill: 2    Return to clinic: Return if symptoms worsen or fail to improve.  Documentation: I have reviewed the above documentation for accuracy and completeness, and I  agree with the above.  Lauraine JAYSON Kanaris, MD

## 2024-01-17 NOTE — Patient Instructions (Addendum)
 Biopsy Wound Care Instructions  Leave the original bandage on for 24 hours if possible.  If the bandage becomes soaked or soiled before that time, it is OK to remove it and examine the wound.  A small amount of post-operative bleeding is normal.  If excessive bleeding occurs, remove the bandage, place gauze over the site and apply continuous pressure (no peeking) over the area for 30 minutes. If this does not work, please call our clinic as soon as possible or page your doctor if it is after hours.   Once a day, cleanse the wound with soap and water. It is fine to shower. If a thick crust develops you may use a Q-tip dipped into dilute hydrogen peroxide (mix 1:1 with water) to dissolve it.  Hydrogen peroxide can slow the healing process, so use it only as needed.    After washing, apply petroleum jelly (Vaseline) or an antibiotic ointment if your doctor prescribed one for you, followed by a bandage.    For best healing, the wound should be covered with a layer of ointment at all times. If you are not able to keep the area covered with a bandage to hold the ointment in place, this may mean re-applying the ointment several times a day.  Continue this wound care until the wound has healed and is no longer open.   Itching and mild discomfort is normal during the healing process. However, if you develop pain or severe itching, please call our office.   If you have any discomfort, you can take Tylenol  (acetaminophen ) or ibuprofen as directed on the bottle. (Please do not take these if you have an allergy to them or cannot take them for another reason).  Some redness, tenderness and white or yellow material in the wound is normal healing.  If the area becomes very sore and red, or develops a thick yellow-green material (pus), it may be infected; please notify us .    If you have stitches, return to clinic as directed to have the stitches removed. You will continue wound care for 2-3 days after the stitches  are removed.   Wound healing continues for up to one year following surgery. It is not unusual to experience pain in the scar from time to time during the interval.  If the pain becomes severe or the scar thickens, you should notify the office.    A slight amount of redness in a scar is expected for the first six months.  After six months, the redness will fade and the scar will soften and fade.  The color difference becomes less noticeable with time.  If there are any problems, return for a post-op surgery check at your earliest convenience.  To improve the appearance of the scar, you can use silicone scar gel, cream, or sheets (such as Mederma or Serica) every night for up to one year. These are available over the counter (without a prescription).  Please call our office at [phone] for any questions or concerns.      Due to recent changes in healthcare laws, you may see results of your pathology and/or laboratory studies on MyChart before the doctors have had a chance to review them. We understand that in some cases there may be results that are confusing or concerning to you. Please understand that not all results are received at the same time and often the doctors may need to interpret multiple results in order to provide you with the best plan of care or  course of treatment. Therefore, we ask that you please give us  2 business days to thoroughly review all your results before contacting the office for clarification. Should we see a critical lab result, you will be contacted sooner.   If You Need Anything After Your Visit  If you have any questions or concerns for your doctor, please call our main line at [phone] and press option 4 to reach your doctor's medical assistant. If no one answers, please leave a voicemail as directed and we will return your call as soon as possible. Messages left after 4 pm will be answered the following business day.   You may also send us  a message  via MyChart. We typically respond to MyChart messages within 1-2 business days.  For prescription refills, please ask your pharmacy to contact our office. Our fax number is [phone].  If you have an urgent issue when the clinic is closed that cannot wait until the next business day, you can page your doctor at the number below.    Please note that while we do our best to be available for urgent issues outside of office hours, we are not available 24/7.   If you have an urgent issue and are unable to reach us , you may choose to seek medical care at your doctor's office, retail clinic, urgent care center, or emergency room.  If you have a medical emergency, please immediately call 911 or go to the emergency department.  Pager Numbers  - Dr. Hester: [phone]  - Dr. Jackquline: [phone]  - Dr. Claudene: [phone]   - Dr. Raymund: [phone]  In the event of inclement weather, please call our main line at [phone] for an update on the status of any delays or closures.  Dermatology Medication Tips: Please keep the boxes that topical medications come in in order to help keep track of the instructions about where and how to use these. Pharmacies typically print the medication instructions only on the boxes and not directly on the medication tubes.   If your medication is too expensive, please contact our office at [phone] option 4 or send us  a message through MyChart.   We are unable to tell what your co-pay for medications will be in advance as this is different depending on your insurance coverage. However, we may be able to find a substitute medication at lower cost or fill out paperwork to get insurance to cover a needed medication.   If a prior authorization is required to get your medication covered by your insurance company, please allow us  1-2 business days to complete this process.  Drug prices often vary depending on where the prescription is filled and some  pharmacies may offer cheaper prices.  The website www.goodrx.com contains coupons for medications through different pharmacies. The prices here do not account for what the cost may be with help from insurance (it may be cheaper with your insurance), but the website can give you the price if you did not use any insurance.  - You can print the associated coupon and take it with your prescription to the pharmacy.  - You may also stop by our office during regular business hours and pick up a GoodRx coupon card.  - If you need your prescription sent electronically to a different pharmacy, notify our office through [facility] or by phone at [phone] option 4.     Si Usted Necesita Algo Despus de Su Visita  Tambin puede enviarnos un mensaje a travs de  MyChart. Por lo general respondemos a los mensajes de MyChart en el transcurso de 1 a 2 das hbiles.  Para renovar recetas, por favor pida a su farmacia que se ponga en contacto con nuestra oficina. Randi lakes de fax es [place] [phone].  Si tiene un asunto urgente cuando la clnica est cerrada y que no puede esperar hasta el siguiente da hbil, puede llamar/localizar a su doctor(a) al nmero que aparece a continuacin.   Por favor, tenga en cuenta que aunque hacemos todo lo posible para estar disponibles para asuntos urgentes fuera del horario de [place], no estamos disponibles las 24 horas del da, los 7 [address]  [address] de la [place].   Si tiene un problema urgente y no puede comunicarse con nosotros, puede optar por buscar atencin mdica  en el consultorio de su doctor(a), en una clnica privada, en un centro de atencin urgente o en una sala de emergencias.  Si tiene Engineer, drilling, por favor llame inmediatamente al 911 o vaya a la sala de emergencias.  Nmeros de bper  - Dr. Hester: [phone]  - Dra. Jackquline: [phone]  - Dr. Claudene: [phone]  - Dra. Kitts: [phone]  En caso de inclemencias del [place],  por favor llame a nuestra lnea principal al [phone] para una actualizacin sobre el estado de cualquier retraso o cierre.  Consejos para la medicacin en dermatologa: Por favor, guarde las cajas en las que vienen los medicamentos de uso tpico para ayudarle a seguir las instrucciones sobre dnde y cmo usarlos. Las farmacias generalmente imprimen las instrucciones del medicamento slo en las cajas y no directamente en los tubos del [place].   Si su medicamento es muy caro, por favor, pngase en contacto con landry rieger llamando al [phone] y presione la opcin 4 o envenos un mensaje a travs de Clinical cytogeneticist.   No podemos decirle cul ser su copago por los medicamentos por adelantado ya que esto es diferente dependiendo de la cobertura de su seguro. Sin embargo, es posible que podamos encontrar un medicamento sustituto a Audiological scientist un formulario para que el seguro cubra el medicamento que se considera necesario.   Si se requiere una autorizacin previa para que su compaa de seguros malta su medicamento, por favor permtanos de 1 a 2 das hbiles para completar este proceso.  Los precios de los medicamentos varan con frecuencia dependiendo del Environmental consultant de dnde se surte la receta y alguna farmacias pueden ofrecer precios ms baratos.  El sitio web www.goodrx.com tiene cupones para medicamentos de Health and safety inspector. Los precios aqu no tienen en cuenta lo que podra costar con la ayuda del seguro (puede ser ms barato con su seguro), pero el sitio web puede darle el precio si no utiliz Tourist information centre manager.  - Puede imprimir el cupn correspondiente y llevarlo con su receta a la farmacia.  - Tambin puede pasar por nuestra oficina durante el horario de atencin regular y Education officer, museum una tarjeta de cupones de GoodRx.  - Si necesita que su receta se enve electrnicamente a una farmacia diferente, informe a nuestra oficina a travs de MyChart de [place] o por telfono llamando al  [phone] y presione la opcin 4.

## 2024-01-21 LAB — SURGICAL PATHOLOGY

## 2024-01-24 ENCOUNTER — Ambulatory Visit: Payer: Self-pay

## 2024-01-24 ENCOUNTER — Ambulatory Visit (INDEPENDENT_AMBULATORY_CARE_PROVIDER_SITE_OTHER): Admitting: General Surgery

## 2024-01-24 ENCOUNTER — Encounter: Payer: Self-pay | Admitting: General Surgery

## 2024-01-24 VITALS — BP 126/89 | HR 77 | Ht 74.0 in | Wt 298.0 lb

## 2024-01-24 DIAGNOSIS — L0591 Pilonidal cyst without abscess: Secondary | ICD-10-CM

## 2024-01-24 NOTE — Telephone Encounter (Signed)
-----   Message from Lauraine JAYSON Kanaris sent at 01/24/2024  9:09 AM EDT ----- 1. Skin, right penile shaft :       VERRUCA VULGARIS   Please notify patient with below plan: Biopsy consistent with verruca/wart - treated in clinic with shave removal  ----- Message ----- From: Interface, Lab In [place] Sent: 01/21/2024   4:53 PM EDT To: Lauraine JAYSON Kanaris, MD

## 2024-01-24 NOTE — Patient Instructions (Signed)
 You have been seen today for a Pilonidal Cyst. You have requested to have your pilonidal cyst excised. This will be done at [facility] with Dr. Marinda.  If you are on any injectable weight loss medication, you will need to stop taking your GLP-1 injectable (weight loss) medications 8 days before your surgery to avoid any complications with anesthesia.   You will need to arrange to be out of work for approximately 1-2 weeks and then have a family member change the dressing 1-2 times daily until this heals from the inside out.   If you have FMLA or disability paperwork that needs filled out you may drop this off at our office or this can be faxed to [phone].  Please see the (blue)pre-care form that you have been given today. Our surgery scheduler will call you to verify surgery date and to go over information.   If you have any questions, please call our office.   Pilonidal Cyst Removal Pilonidal cyst removal is a procedure to remove a fluid-filled sac (cyst) that forms under the skin near the tailbone, at the top of the crease between the buttocks (pilonidal area). This procedure is also called a pilonidal cystectomy.  Pilonidal cyst is caused by an ingrown hair that irritates the area. Sometimes a tunnel (sinus) forms under the skin from the cyst and makes a second opening in the skin. In that case, the sinus area may also be removed during the procedure. You may need this procedure if you have a cyst that is large, painful, or keeps getting infected. A cyst that becomes infected is called an abscess. The abscess may need to be opened, drained, and treated with antibiotics before the cyst is removed. Tell your health care provider about: Any allergies you have. All medicines you are taking, including vitamins, herbs, eye drops, creams, and over-the-counter medicines. Any problems you or family members have had with anesthetic medicines. Any bleeding problems you have. Any  surgeries you have had. Any medical conditions you have. Whether you are pregnant or may be pregnant. Any recent fever, increase in pain, or discharge from the cyst. What are the risks? Your health care provider will talk with you about risks. These may include: Delay in healing. This is the most common problem. Infection. Bleeding. Allergic reactions to medicines. A closed incision opening. The cyst coming back again (recurrence). What happens before the procedure? Medicines Ask your health care provider about: Changing or stopping your regular medicines. These include any diabetes medicines or blood thinners you take. Taking medicines such as aspirin and ibuprofen . These medicines can thin your blood. Do not take them unless your health care provider tells you to. Taking over-the-counter medicines, vitamins, herbs, and supplements. Surgery safety Ask your health care provider: How your surgery site will be marked. What steps will be taken to help prevent infection. These steps may include: Removing hair at the surgery site. Washing skin with a soap that kills germs. Taking antibiotics. General instructions Do not use any products that contain nicotine  or tobacco for at least 4 weeks before the procedure. These products include cigarettes, chewing tobacco, and vaping devices, such as e-cigarettes. If you need help quitting, ask your health care provider. If you will be going home right after the procedure, plan to have a responsible adult: Take you home from the hospital or clinic. You will not be allowed to drive. Care for you for the time you are told. You may need help with wound care  and dressing changes. What happens during the procedure?  An IV will be inserted into one of your veins. You may be given: A sedative. This helps you relax. Anesthesia. This will: Numb certain areas of your body. Make you fall asleep for surgery. Your surgeon will make an incision near the  cyst. Depending on the size of the cyst and if the sinus is infected, one of the following will be done: If there is an abscess, a small hole will be made in the cyst. The pus will be drained out. If the sinus is large or keeps getting infected, your surgeon may: Cut out the sinus and remove some of the skin around it. The wound will be left open to heal on its own. Remove the sinus and cut out a flap on either side of it. The two sides will be stitched together. A thin, flexible tube with a camera (endoscope) may be used before this procedure to better see the area. The surgeon may remove hair and infected tissue. The sinus will then be cleaned with a solution. Heat will be used to seal the sinus. The incision may be left open or closed. An open incision may be packed with gauze and covered with a bandage (dressing). An incision may be closed with stitches (sutures) and covered with a dressing. The area may be sealed with fibrin glue and covered with a dressing. The procedure may vary among health care providers and hospitals. What happens after the procedure? Your blood pressure, heart rate, breathing rate, and blood oxygen level will be monitored until you leave the hospital or clinic. You will be given medicine for pain as needed. If you were given a sedative during the procedure, it can affect you for several hours. Do not drive or operate machinery until your health care provider says that it is safe. Your health care provider will give you instructions for taking care of your dressing at home after the procedure. If your incision was left open and packed with gauze, you will need to change your dressing every day. Summary Pilonidal cyst removal is surgery to remove a fluid-filled sac (cyst) that forms in the crease between the buttocks. The incision used to remove the cyst may be closed with sutures or left open. If left open, it may be packed with gauze and covered with a dressing. You  will be given medicine for pain as needed. Your health care provider will give you instructions for taking care of your dressing at home. This information is not intended to replace advice given to you by your health care provider. Make sure you discuss any questions you have with your health care provider. Document Revised: 07/15/2021 Document Reviewed: 07/15/2021 Elsevier Patient Education  2024 [employer].

## 2024-01-24 NOTE — Telephone Encounter (Signed)
Left message for patient to return my call. aw 

## 2024-01-24 NOTE — Telephone Encounter (Addendum)
 Patient returned call and left message on nurse line. Returned call about bx results. Patient verbalized understanding. Denied further questions.  ----- Message from Lauraine JAYSON Kanaris sent at 01/24/2024  9:09 AM EDT ----- 1. Skin, right penile shaft :       VERRUCA VULGARIS   Please notify patient with below plan: Biopsy consistent with verruca/wart - treated in clinic with shave removal  ----- Message ----- From: Interface, Lab In [facility] Sent: 01/21/2024   4:53 PM EDT To: Lauraine JAYSON Kanaris, MD

## 2024-01-25 ENCOUNTER — Encounter: Payer: Self-pay | Admitting: General Surgery

## 2024-01-25 ENCOUNTER — Telehealth: Payer: Self-pay | Admitting: General Surgery

## 2024-01-25 NOTE — Telephone Encounter (Signed)
 Patient has been advised of Pre-Admission date/time, and Surgery date at [facility].  Surgery Date: 02/15/24 Preadmission Testing Date: 02/11/24 (phone 1p-4p)  Patient informed of the scheduling process and surgery information given at time of office visit.   Patient has been made aware to call [phone], between 1-3:00pm the day before surgery, to find out what time to arrive for surgery.

## 2024-01-30 NOTE — Progress Notes (Signed)
 01/31/2024 Kurt Williamson [id number] 1970/09/08  Gastroenterology Office Note    Referring Provider: Manya Toribio SQUIBB, PA Primary Care Physician:  Manya Toribio SQUIBB, PA  Primary GI Provider: Jinny Carmine, MD    Chief Complaint   Chief Complaint  Patient presents with   New Patient (Initial Visit)    RUQ pain-nausea, normal BM's-fells like stomach is rumbling all the time-     History of Present Illness   Kurt Williamson is a 53 y.o. male presenting today at the request of Manya Toribio SQUIBB, PA due to RUQ abdominal pain, nausea, and GERD.   Patient was seen by PCP on 12/13/2023 with complaints of episodic RUQ abdominal pain, was prescribed famotidine  20 mg twice daily.  Previously was on omeprazole since 2010, but it suddenly stopped working.  He was having worse heartburn and had episode of severe right upper quadrant abdominal pain.  Pain is not as bad as it was in August, but still persists off and on.  Pain will radiate up into his chest and through his back and shoulder blades.  He is still getting pressure and tightness after eating.  This can last from several minutes to about 15 minutes, usually occurs maybe 30 minutes or an hour after eating.   Reports his stomach feels like it is rumbling all the time, feels like gas pains.  He is having a lot of belching and dyspepsia. When he lays down, feels like a weird gas sensation is coming up into his esopagus. He drinks 1 cup of coffee daily and ginger ale.  He does drink a lot of water throughout the day.  Denies NSAID use, rarely drinks alcohol.  Reports taking fiber daily with normal bowel movements, has a formed BM daily.  Denies unintential weight loss, dysphagia, vomiting, melena or hematochezia.  Has not had a screening colonoscopy. No family history of colon cancer or polyps.   12/21/2023 US  abdomen right upper quadrant IMPRESSION: Diffuse increased echogenicity of the hepatic parenchyma is a nonspecific indicator  of hepatocellular dysfunction, most commonly steatosis.   Past Medical History:  Diagnosis Date   Anxiety    GERD (gastroesophageal reflux disease) 2006   Take Prilosec   Hypertension 2004   Dad also was hypertensive.   Neuromuscular disorder (HCC) 05/29/2022   Pinched nerve, now some sciatica issues    Past Surgical History:  Procedure Laterality Date   WISDOM TOOTH EXTRACTION      Current Outpatient Medications  Medication Sig Dispense Refill   doxycycline  (ADOXA) 100 MG tablet Take 100 mg twice daily for 14 days when flared for pilonidal cyst. Take food and drink. 28 tablet 2   famotidine  (PEPCID ) 20 MG tablet Take 1 tablet (20 mg total) by mouth 2 (two) times daily as needed for heartburn or indigestion. 90 tablet 1   FIBER PO Take by mouth.     gabapentin  (NEURONTIN ) 300 MG capsule Take 1 capsule in the morning, 1 capsule in the afternoon, and 3 capsules at bedtime. Total 1500 mg daily.     metoprolol  succinate (TOPROL -XL) 50 MG 24 hr tablet TAKE 1 TABLET(50 MG) BY MOUTH DAILY WITH OR IMMEDIATELY FOLLOWING A MEAL 90 tablet 3   Multiple Vitamin (MULTIVITAMIN) tablet Take 1 tablet by mouth daily.     Na Sulfate-K Sulfate-Mg Sulfate concentrate (SUPREP) 17.5-3.13-1.6 GM/177ML SOLN Take 1 kit (354 mLs total) by mouth once for 1 dose. At 5 PM the day before your procedure pour the contents of one  bottle of Suprep into the mixing container provided.  Fill the container, with ice cold water, up to the 16 oz fill line, and drink the entire amount. Then 5 hours before procedure pour the contents of the second bottle of Suprep into the mixing container provided and follow the same instructions. 354 mL 0   Omega-3 Fatty Acids (FISH OIL PO) Take by mouth.     Potassium Chloride  ER 20 MEQ TBCR Take 1 tablet (20 mEq total) by mouth daily. 90 tablet 3   telmisartan -hydrochlorothiazide (MICARDIS  HCT) 80-12.5 MG tablet Take 1 tablet by mouth daily. 90 tablet 3   No current facility-administered  medications for this visit.    Allergies as of 01/31/2024   (No Known Allergies)    Family History  Problem Relation Age of Onset   Arthritis Father    Hypertension Father     Social History   Socioeconomic History   Marital status: Significant Other    Spouse name: Not on file   Number of children: 0   Years of education: Not on file   Highest education level: Bachelor's degree (e.g., BA, AB, BS)  Occupational History   Not on file  Tobacco Use   Smoking status: Never   Smokeless tobacco: Never  Vaping Use   Vaping status: Never Used  Substance and Sexual Activity   Alcohol use: Not Currently   Drug use: Never   Sexual activity: Yes    Birth control/protection: Condom, None  Other Topics Concern   Not on file  Social History Narrative   Not on file   Social Drivers of Health   Financial Resource Strain: Low Risk  (12/13/2023)   Overall Financial Resource Strain (CARDIA)    Difficulty of Paying Living Expenses: Not very hard  Food Insecurity: No Food Insecurity (12/13/2023)   Hunger Vital Sign    Worried About Running Out of Food in the Last Year: Never true    Ran Out of Food in the Last Year: Never true  Transportation Needs: No Transportation Needs (12/13/2023)   PRAPARE - Administrator, Civil Service (Medical): No    Lack of Transportation (Non-Medical): No  Physical Activity: Insufficiently Active (12/13/2023)   Exercise Vital Sign    Days of Exercise per Week: 2 days    Minutes of Exercise per Session: 30 min  Stress: No Stress Concern Present (12/13/2023)   [employer] of Occupational Health - Occupational Stress Questionnaire    Feeling of Stress: Only a little  Social Connections: Socially Isolated (12/13/2023)   Social Connection and Isolation Panel    Frequency of Communication with Friends and Family: Once a week    Frequency of Social Gatherings with Friends and Family: Once a week    Attends Religious Services: Never    Automotive engineer or Organizations: No    Attends Engineer, structural: Not on file    Marital Status: Living with partner  Intimate Partner Violence: Not At Risk (12/13/2023)   Humiliation, Afraid, Rape, and Kick questionnaire    Fear of Current or Ex-Partner: No    Emotionally Abused: No    Physically Abused: No    Sexually Abused: No     RELEVANT GI HISTORY, IMAGING AND LABS: CBC    Component Value Date/Time   WBC 8.0 12/14/2023 1006   WBC 8.4 12/03/2021 0853   RBC 5.09 12/14/2023 1006   RBC 5.41 12/03/2021 0853   HGB 15.5 12/14/2023 1006  HCT 45.6 12/14/2023 1006   PLT 216 12/14/2023 1006   MCV 90 12/14/2023 1006   MCH 30.5 12/14/2023 1006   MCH 29.2 12/03/2021 0853   MCHC 34.0 12/14/2023 1006   MCHC 33.5 12/03/2021 0853   RDW 12.7 12/14/2023 1006   LYMPHSABS 2.8 12/14/2023 1006   EOSABS 0.3 12/14/2023 1006   BASOSABS 0.1 12/14/2023 1006   Recent Labs    12/14/23 1006  HGB 15.5    CMP     Component Value Date/Time   NA 139 12/14/2023 1006   K 4.5 12/14/2023 1006   CL 102 12/14/2023 1006   CO2 22 12/14/2023 1006   GLUCOSE 86 12/14/2023 1006   GLUCOSE 128 (H) 12/03/2021 0853   BUN 17 12/14/2023 1006   CREATININE 1.29 (H) 12/14/2023 1006   CREATININE 1.05 01/03/2021 0935   CALCIUM 9.5 12/14/2023 1006   PROT 7.4 12/14/2023 1006   ALBUMIN 4.6 12/14/2023 1006   AST 19 12/14/2023 1006   ALT 21 12/14/2023 1006   ALKPHOS 82 12/14/2023 1006   BILITOT 0.9 12/14/2023 1006   GFRNONAA >60 12/03/2021 0853   GFRAA >60 07/11/2019 1509      Latest Ref Rng & Units 12/14/2023   10:06 AM 10/20/2022    4:38 PM 07/24/2022    8:19 AM  Hepatic Function  Total Protein 6.0 - 8.5 g/dL 7.4  8.0  7.3   Albumin 3.8 - 4.9 g/dL 4.6  5.0  4.6   AST 0 - 40 IU/L 19  18  12    ALT 0 - 44 IU/L 21  24  18    Alk Phosphatase 44 - 121 IU/L 82  95  64   Total Bilirubin 0.0 - 1.2 mg/dL 0.9  0.6  0.9       Review of Systems   All systems reviewed and negative except where noted  in HPI.    Physical Exam  BP 117/79   Pulse 69   Temp 98.3 F (36.8 C)   Ht 6' 2 (1.88 m)   Wt (!) 301 lb 9.6 oz (136.8 kg)   SpO2 98%   BMI 38.72 kg/m  No LMP for male patient. General:   Alert and oriented. Pleasant and cooperative. Well-nourished and well-developed. NAD Head:  Normocephalic and atraumatic. Eyes:  Without icterus Ears:  Normal auditory acuity. Neck:  Supple; no masses or thyromegaly. Lungs:  Respirations even and unlabored.  Clear throughout to auscultation.   No wheezes, crackles, or rhonchi. No acute distress. Heart:  Regular rate and rhythm; no murmurs, clicks, rubs, or gallops. Abdomen:  Normal bowel sounds.  No bruits.  Soft, non-tender and non-distended without masses, hepatosplenomegaly or hernias noted.  No guarding or rebound tenderness.   Rectal:  Deferred. Msk:  Symmetrical without gross deformities. Normal posture. Extremities:  Without edema. Neurologic:  Alert and  oriented x4;  grossly normal neurologically. Skin:  Intact without significant lesions or rashes. Psych:  Alert and cooperative. Normal mood and affect.   Assessment & Plan   Kurt Williamson is a 53 y.o. male presenting today with intermittent RUQ abdominal pain, nausea, and GERD.   RUQ abd pain: liver with likely steatosis, gallbladder unremarkable on US  12/21/2023. Continues to have intermittent RUQ pain after eating that radiates up chest and to shoulder blades.  - Will check lipase, amylase - order HIDA scan   GERD with associated dyspepsia - Hold famotidine  for 48 hours then breath test for H. pylori - Proceed with EGD to evaluate  for esophagitis, barrett's, PUD  Screening colonoscopy: no prior colonoscopy.  Proceed with colonoscopy near future: the risks, benefits, and alternatives have been discussed with the patient in detail, which include, but are not limited to: bleeding, infection, perforation & drug reaction. The patient states understanding and desires to  proceed.  Follow up in 4 weeks   Grayce Bohr, DNP, AGNP-C [facility]

## 2024-01-31 ENCOUNTER — Ambulatory Visit (INDEPENDENT_AMBULATORY_CARE_PROVIDER_SITE_OTHER): Admitting: Family Medicine

## 2024-01-31 ENCOUNTER — Encounter: Payer: Self-pay | Admitting: Family Medicine

## 2024-01-31 VITALS — BP 117/79 | HR 69 | Temp 98.3°F | Ht 74.0 in | Wt 301.6 lb

## 2024-01-31 DIAGNOSIS — R1085 Abdominal pain of multiple sites: Secondary | ICD-10-CM

## 2024-01-31 DIAGNOSIS — K219 Gastro-esophageal reflux disease without esophagitis: Secondary | ICD-10-CM | POA: Diagnosis not present

## 2024-01-31 DIAGNOSIS — R1011 Right upper quadrant pain: Secondary | ICD-10-CM

## 2024-01-31 DIAGNOSIS — R110 Nausea: Secondary | ICD-10-CM

## 2024-01-31 DIAGNOSIS — Z1211 Encounter for screening for malignant neoplasm of colon: Secondary | ICD-10-CM

## 2024-01-31 DIAGNOSIS — R143 Flatulence: Secondary | ICD-10-CM

## 2024-01-31 MED ORDER — NA SULFATE-K SULFATE-MG SULF 17.5-3.13-1.6 GM/177ML PO SOLN: 1.0000 | Freq: Once | ORAL | 0 refills | Status: AC

## 2024-01-31 NOTE — Patient Instructions (Addendum)
 Hold famotine for 48 hours and then have breath test performed. Do not take any Opiate based drugs 24 hours prior to [facility] scan.  Hida scan scheduled @ [facility] 02/12/24 @ 8:30 arrive. Nothing to eat/drink after midnight. Please pick up your bowel prep at your pharmacy.

## 2024-02-03 ENCOUNTER — Encounter: Payer: Self-pay | Admitting: Family Medicine

## 2024-02-04 ENCOUNTER — Telehealth: Payer: Self-pay

## 2024-02-04 ENCOUNTER — Other Ambulatory Visit: Payer: Self-pay

## 2024-02-04 DIAGNOSIS — K219 Gastro-esophageal reflux disease without esophagitis: Secondary | ICD-10-CM

## 2024-02-04 DIAGNOSIS — Z1211 Encounter for screening for malignant neoplasm of colon: Secondary | ICD-10-CM

## 2024-02-04 NOTE — Telephone Encounter (Signed)
 Spoke with patient -rescheduled Colon/egd  04/21/24 in [place].

## 2024-02-04 NOTE — Telephone Encounter (Signed)
 Patient cancelled procedures. LVM for him to call back. Grayce is going to send my chart message to see why.

## 2024-02-06 LAB — SPECIMEN STATUS REPORT

## 2024-02-06 LAB — LIPASE: Lipase: 24 U/L (ref 13–78)

## 2024-02-06 LAB — AMYLASE: Amylase: 51 U/L (ref 31–110)

## 2024-02-07 ENCOUNTER — Ambulatory Visit: Payer: Self-pay | Admitting: Family Medicine

## 2024-02-10 ENCOUNTER — Ambulatory Visit: Payer: Self-pay | Admitting: General Surgery

## 2024-02-10 DIAGNOSIS — L0591 Pilonidal cyst without abscess: Secondary | ICD-10-CM

## 2024-02-10 NOTE — Progress Notes (Signed)
 Patient ID: Kurt Williamson, male   DOB: November 30, 1970, 53 y.o.   MRN: [id number] CC: Pilonidal Cyst Disease History of Present Illness Kurt Williamson is a 53 y.o. male with past medical history as below who presents in consultation for pilonidal cyst disease.  The patient reports that he has had recurrent abscesses and drainage in his gluteal cleft for some time.  He reports that.  Every 4 to 5 months this will recur.  He has some drainage from it.  He recently went to his dermatologist and was prescribed doxycycline  and referred to our office.  Denies any blood in stool.  Past Medical History Past Medical History:  Diagnosis Date   Anxiety    GERD (gastroesophageal reflux disease) 2006   Hypertension 2004   Sciatica        Past Surgical History:  Procedure Laterality Date   WISDOM TOOTH EXTRACTION      Allergies  Allergen Reactions   Covid-19 (Mrna Bivalent) Vaccine Proofreader) [Covid-19 (Mrna) Vaccine] Palpitations    Sweating, shakes (1st dose)    Current Outpatient Medications  Medication Sig Dispense Refill   doxycycline  (ADOXA) 100 MG tablet Take 100 mg twice daily for 14 days when flared for pilonidal cyst. Take food and drink. (Patient not taking: Reported on 02/06/2024) 28 tablet 2   famotidine  (PEPCID ) 20 MG tablet Take 1 tablet (20 mg total) by mouth 2 (two) times daily as needed for heartburn or indigestion. 90 tablet 1   FIBER PO Take 3-4 capsules by mouth daily.     gabapentin  (NEURONTIN ) 300 MG capsule Take 1 capsule in the morning, 1 capsule in the afternoon, and 3 capsules at bedtime. Total 1500 mg daily. (Patient taking differently: Take 300-900 mg by mouth See admin instructions. Take 600 mg by mouth in the morning, 300 mg in the afternoon, and 900 mg at bedtime.)     metoprolol  succinate (TOPROL -XL) 50 MG 24 hr tablet TAKE 1 TABLET(50 MG) BY MOUTH DAILY WITH OR IMMEDIATELY FOLLOWING A MEAL (Patient taking differently: Take 50 mg by mouth every evening.) 90 tablet 3    Multiple Vitamin (MULTIVITAMIN) tablet Take 1 tablet by mouth daily.     Omega-3 Fatty Acids (FISH OIL PO) Take 1 capsule by mouth daily.     Potassium Chloride  ER 20 MEQ TBCR Take 1 tablet (20 mEq total) by mouth daily. 90 tablet 3   telmisartan -hydrochlorothiazide (MICARDIS  HCT) 80-12.5 MG tablet Take 1 tablet by mouth daily. 90 tablet 3   No current facility-administered medications for this visit.    Family History Family History  Problem Relation Age of Onset   Arthritis Father    Hypertension Father        Social History Social History   Tobacco Use   Smoking status: Never   Smokeless tobacco: Never  Vaping Use   Vaping status: Never Used  Substance Use Topics   Alcohol use: Not Currently   Drug use: Never        ROS Full ROS of systems performed and is otherwise negative there than what is stated in the HPI  Physical Exam Blood pressure 126/89, pulse 77, height 6' 2 (1.88 m), weight 298 lb (135.2 kg), SpO2 97%.  Alert and oriented x 3, normal nondistended, gluteal cleft exam performed in the presence of a chaperone.  He has evidence of previous abscesses and several pits disease.  Data Reviewed I reviewed his notes from his dermatologist though consistent with pilonidal cyst disease.  I have  personally reviewed the patient's imaging and medical records.    Assessment    Patient with pilonidal cyst disease  Plan    I discussed the treatment of pilonidal cyst disease.  We also discussed the pathophysiology of this disease.  We talked about the different options for surgical intervention to include a minimally invasive approach such as a gipps procedure, wide local excision and then different flap reconstructions.  I discussed with him that I would start with a gipps procedure due to his minimally invasive nature and success.  I discussed with him that the biggest risk of the surgery is risk of recurrence.  If it does recur then we can talk about the need for  level excision of the disease.  I discussed other risk, benefits and alternatives including risk of infection, bleeding and cosmetic outcomes that are poor.  He understands all these risks and wishes to proceed with surgery.  A total of 45 minutes was spent reviewing the patient's chart, performing history and physical and discussing treatment options with the patient.    Kurt Williamson

## 2024-02-11 ENCOUNTER — Encounter: Payer: Self-pay | Admitting: Urgent Care

## 2024-02-11 ENCOUNTER — Encounter
Admission: RE | Admit: 2024-02-11 | Discharge: 2024-02-11 | Disposition: A | Discharge status: Home / Self Care | Source: Ambulatory Visit | Attending: General Surgery | Admitting: General Surgery

## 2024-02-11 ENCOUNTER — Other Ambulatory Visit: Payer: Self-pay

## 2024-02-11 DIAGNOSIS — Z01818 Encounter for other preprocedural examination: Secondary | ICD-10-CM

## 2024-02-11 DIAGNOSIS — I10 Essential (primary) hypertension: Secondary | ICD-10-CM

## 2024-02-11 DIAGNOSIS — Z01810 Encounter for preprocedural cardiovascular examination: Secondary | ICD-10-CM | POA: Diagnosis not present

## 2024-02-11 HISTORY — DX: Sciatica, unspecified side: M54.30

## 2024-02-11 HISTORY — DX: Pilonidal cyst without abscess: L05.91

## 2024-02-11 NOTE — Patient Instructions (Addendum)
 Your procedure is scheduled nw:[id number] 02/15/24 Report to the Registration Desk on the 1st floor of the [facility]. To find out your arrival time, please call [phone] between 1PM - 3PM on: THURSDAY 02/14/24 If your arrival time is 6:00 am, do not arrive before that time as the [facility] entrance doors do not open until 6:00 am.  REMEMBER: Instructions that are not followed completely may result in serious medical risk, up to and including death; or upon the discretion of your surgeon and anesthesiologist your surgery may need to be rescheduled.  Do not eat food after midnight the night before surgery.  No gum chewing or hard candies.  One week prior to surgery: Stop Anti-inflammatories (NSAIDS) such as Advil, Aleve, Ibuprofen, Motrin, Naproxen, Naprosyn and Aspirin based products such as Excedrin, Goody's Powder, BC Powder.  Stop ANY OVER THE COUNTER supplements until after surgery.  You may however, continue to take Tylenol  if needed for pain up until the day of surgery.  Continue taking all of your other prescription medications up until the day of surgery.   ON THE DAY OF SURGERY ONLY TAKE THESE MEDICATIONS WITH SIPS OF WATER:  famotidine  (PEPCID )  gabapentin  (NEURONTIN )   Use inhalers on the day of surgery and bring to the hospital.  No Alcohol for 24 hours before or after surgery.  No Smoking including e-cigarettes for 24 hours before surgery.  No chewable tobacco products for at least 6 hours before surgery.  No nicotine patches on the day of surgery.  Do not use any recreational drugs for at least a week (preferably 2 weeks) before your surgery.  Please be advised that the combination of cocaine and anesthesia may have negative outcomes, up to and including death. If you test positive for cocaine, your surgery will be cancelled.  On the morning of surgery brush your teeth with toothpaste and water, you may rinse your mouth with mouthwash if you wish. Do not  swallow any toothpaste or mouthwash.  Use CHG Soap or wipes as directed on instruction sheet.  Do not wear jewelry, make-up, hairpins, clips or nail polish.  For welded (permanent) jewelry: bracelets, anklets, waist bands, etc.  Please have this removed prior to surgery.  If it is not removed, there is a chance that hospital personnel will need to cut it off on the day of surgery.  Do not wear lotions, powders, or perfumes.   Do not shave body hair from the neck down 48 hours before surgery.  Contact lenses, hearing aids and dentures may not be worn into surgery.  Do not bring valuables to the hospital. [facility] is not responsible for any missing/lost belongings or valuables.   Bring your C-PAP to the hospital in case you may have to spend the night.   Notify your doctor if there is any change in your medical condition (cold, fever, infection).  Wear comfortable clothing (specific to your surgery type) to the hospital.  After surgery, you can help prevent lung complications by doing breathing exercises.  Take deep breaths and cough every 1-2 hours. Your doctor may order a device called an Incentive Spirometer to help you take deep breaths. When coughing or sneezing, hold a pillow firmly against your incision with both hands. This is called "splinting." Doing this helps protect your incision. It also decreases belly discomfort.  If you are being discharged the day of surgery, you will not be allowed to drive home. You will need a responsible individual to drive you  home and stay with you for 24 hours after surgery.   If you are taking public transportation, you will need to have a responsible individual with you.  Please call the Pre-admissions Testing Dept. at [phone] if you have any questions about these instructions.  Surgery Visitation Policy:  Patients having surgery or a procedure may have two visitors.  Children under the age of 67 must have an adult with them who  is not the patient.  Merchandiser, retail to address health-related social needs:  [email or web address]                                                                                                 Preparing for Surgery with CHLORHEXIDINE GLUCONATE (CHG) Soap  Chlorhexidine Gluconate (CHG) Soap  o An antiseptic cleaner that kills germs and bonds with the skin to continue killing germs even after washing  o Used for showering the night before surgery and morning of surgery  Before surgery, you can play an important role by reducing the number of germs on your skin.  CHG (Chlorhexidine gluconate) soap is an antiseptic cleanser which kills germs and bonds with the skin to continue killing germs even after washing.  Please do not use if you have an allergy to CHG or antibacterial soaps. If your skin becomes reddened/irritated stop using the CHG.  1. Shower the NIGHT BEFORE SURGERY with CHG soap.  2. If you choose to wash your hair, wash your hair first as usual with your normal shampoo.  3. After shampooing, rinse your hair and body thoroughly to remove the shampoo.  4. Use CHG as you would any other liquid soap. You can apply CHG directly to the skin and wash gently with a clean washcloth.  5. Apply the CHG soap to your body only from the neck down. Do not use on open wounds or open sores. Avoid contact with your eyes, ears, mouth, and genitals (private parts). Wash face and genitals (private parts) with your normal soap.  6. Wash thoroughly, paying special attention to the area where your surgery will be performed.  7. Thoroughly rinse your body with warm water.  8. Do not shower/wash with your normal soap after using and rinsing off the CHG soap.  9. Do not use lotions, oils, etc., after showering with CHG.  10. Pat yourself dry with a clean towel.  11. Wear clean pajamas to bed the night before surgery.  12. Place clean sheets on your bed the night of  your shower and do not sleep with pets.  13. Do not apply any deodorants/lotions/powders.  14. Please wear clean clothes to the hospital.  15. Remember to brush your teeth with your regular toothpaste.

## 2024-02-12 ENCOUNTER — Encounter
Admission: RE | Admit: 2024-02-12 | Discharge: 2024-02-12 | Disposition: A | Discharge status: Home / Self Care | Source: Ambulatory Visit | Attending: Family Medicine | Admitting: Family Medicine

## 2024-02-12 DIAGNOSIS — R1011 Right upper quadrant pain: Secondary | ICD-10-CM | POA: Diagnosis present

## 2024-02-12 MED ORDER — TECHNETIUM TC 99M MEBROFENIN IV KIT
5.0000 | PACK | Freq: Once | INTRAVENOUS | Status: AC | PRN
Start: 2024-02-12 — End: 2024-02-12
  Administered 2024-02-12: 5.44 via INTRAVENOUS

## 2024-02-14 MED ORDER — LACTATED RINGERS IV SOLN: INTRAVENOUS | Status: DC

## 2024-02-14 MED ORDER — CHLORHEXIDINE GLUCONATE CLOTH 2 % EX PADS
6.0000 | MEDICATED_PAD | Freq: Once | CUTANEOUS | Status: AC
  Administered 2024-02-15: 6 via TOPICAL

## 2024-02-14 MED ORDER — CHLORHEXIDINE GLUCONATE 0.12 % MT SOLN
15.0000 mL | Freq: Once | OROMUCOSAL | Status: AC
  Administered 2024-02-15: 15 mL via OROMUCOSAL

## 2024-02-14 MED ORDER — ORAL CARE MOUTH RINSE: 15.0000 mL | Freq: Once | OROMUCOSAL | Status: AC

## 2024-02-14 MED ORDER — CEFAZOLIN SODIUM-DEXTROSE 2-4 GM/100ML-% IV SOLN
2.0000 g | INTRAVENOUS | Status: AC
  Administered 2024-02-15: 2 g via INTRAVENOUS

## 2024-02-15 ENCOUNTER — Encounter
Admission: RE | Disposition: A | Discharge status: Home / Self Care | Payer: Self-pay | Source: Home / Self Care | Attending: General Surgery

## 2024-02-15 ENCOUNTER — Ambulatory Visit
Admission: RE | Admit: 2024-02-15 | Discharge: 2024-02-15 | Disposition: A | Discharge status: Home / Self Care | Attending: General Surgery | Admitting: General Surgery

## 2024-02-15 ENCOUNTER — Ambulatory Visit: Admitting: Anesthesiology

## 2024-02-15 ENCOUNTER — Other Ambulatory Visit: Payer: Self-pay

## 2024-02-15 ENCOUNTER — Encounter: Payer: Self-pay | Admitting: General Surgery

## 2024-02-15 ENCOUNTER — Encounter: Payer: Self-pay | Admitting: Urgent Care

## 2024-02-15 DIAGNOSIS — Z6838 Body mass index (BMI) 38.0-38.9, adult: Secondary | ICD-10-CM | POA: Diagnosis not present

## 2024-02-15 DIAGNOSIS — Z79899 Other long term (current) drug therapy: Secondary | ICD-10-CM | POA: Diagnosis not present

## 2024-02-15 DIAGNOSIS — E669 Obesity, unspecified: Secondary | ICD-10-CM | POA: Insufficient documentation

## 2024-02-15 DIAGNOSIS — L0591 Pilonidal cyst without abscess: Secondary | ICD-10-CM | POA: Diagnosis present

## 2024-02-15 DIAGNOSIS — F419 Anxiety disorder, unspecified: Secondary | ICD-10-CM | POA: Insufficient documentation

## 2024-02-15 DIAGNOSIS — Z01818 Encounter for other preprocedural examination: Secondary | ICD-10-CM

## 2024-02-15 DIAGNOSIS — I10 Essential (primary) hypertension: Secondary | ICD-10-CM | POA: Diagnosis not present

## 2024-02-15 HISTORY — PX: PILONIDAL CYST EXCISION: SHX744

## 2024-02-15 SURGERY — EXCISION, PILONIDAL CYST, EXTENSIVE
Anesthesia: General

## 2024-02-15 MED ORDER — CEFAZOLIN SODIUM-DEXTROSE 2-4 GM/100ML-% IV SOLN
INTRAVENOUS | Status: AC
  Filled 2024-02-15: qty 100

## 2024-02-15 MED ORDER — DEXMEDETOMIDINE HCL IN NACL 80 MCG/20ML IV SOLN
INTRAVENOUS | Status: DC | PRN
  Administered 2024-02-15 (×2): 8 ug via INTRAVENOUS
  Administered 2024-02-15: 4 ug via INTRAVENOUS

## 2024-02-15 MED ORDER — BUPIVACAINE-EPINEPHRINE (PF) 0.5% -1:200000 IJ SOLN
INTRAMUSCULAR | Status: AC
  Filled 2024-02-15: qty 30

## 2024-02-15 MED ORDER — DEXMEDETOMIDINE HCL IN NACL 80 MCG/20ML IV SOLN
INTRAVENOUS | Status: AC
  Filled 2024-02-15: qty 20

## 2024-02-15 MED ORDER — LIDOCAINE HCL (CARDIAC) PF 100 MG/5ML IV SOSY
PREFILLED_SYRINGE | INTRAVENOUS | Status: DC | PRN
  Administered 2024-02-15: 80 mg via INTRAVENOUS

## 2024-02-15 MED ORDER — BUPIVACAINE-EPINEPHRINE (PF) 0.5% -1:200000 IJ SOLN
INTRAMUSCULAR | Status: DC | PRN
  Administered 2024-02-15: 10 mL

## 2024-02-15 MED ORDER — FENTANYL CITRATE (PF) 100 MCG/2ML IJ SOLN: 25.0000 ug | INTRAMUSCULAR | Status: DC | PRN

## 2024-02-15 MED ORDER — PROPOFOL 500 MG/50ML IV EMUL
INTRAVENOUS | Status: DC | PRN
  Administered 2024-02-15: 120 mg via INTRAVENOUS
  Administered 2024-02-15: 120 ug/kg/min via INTRAVENOUS

## 2024-02-15 MED ORDER — MIDAZOLAM HCL (PF) 2 MG/2ML IJ SOLN
INTRAMUSCULAR | Status: DC | PRN
  Administered 2024-02-15: 2 mg via INTRAVENOUS

## 2024-02-15 MED ORDER — MIDAZOLAM HCL 2 MG/2ML IJ SOLN
INTRAMUSCULAR | Status: AC
  Filled 2024-02-15: qty 2

## 2024-02-15 MED ORDER — FENTANYL CITRATE (PF) 100 MCG/2ML IJ SOLN
INTRAMUSCULAR | Status: DC | PRN
  Administered 2024-02-15: 50 ug via INTRAVENOUS

## 2024-02-15 MED ORDER — FENTANYL CITRATE (PF) 100 MCG/2ML IJ SOLN
INTRAMUSCULAR | Status: AC
  Filled 2024-02-15: qty 2

## 2024-02-15 MED ORDER — OXYCODONE HCL 5 MG/5ML PO SOLN: 5.0000 mg | Freq: Once | ORAL | Status: DC | PRN

## 2024-02-15 MED ORDER — ACETAMINOPHEN 10 MG/ML IV SOLN: 1000.0000 mg | Freq: Once | INTRAVENOUS | Status: DC | PRN

## 2024-02-15 MED ORDER — GLYCOPYRROLATE 0.2 MG/ML IJ SOLN
INTRAMUSCULAR | Status: DC | PRN
Start: 2024-02-15 — End: 2024-02-15
  Administered 2024-02-15: .2 mg via INTRAVENOUS

## 2024-02-15 MED ORDER — DROPERIDOL 2.5 MG/ML IJ SOLN: 0.6250 mg | Freq: Once | INTRAMUSCULAR | Status: DC | PRN

## 2024-02-15 MED ORDER — DEXAMETHASONE SOD PHOSPHATE PF 10 MG/ML IJ SOLN
INTRAMUSCULAR | Status: DC | PRN
  Administered 2024-02-15: 10 mg via INTRAVENOUS

## 2024-02-15 MED ORDER — 0.9 % SODIUM CHLORIDE (POUR BTL) OPTIME
TOPICAL | Status: DC | PRN
  Administered 2024-02-15: 500 mL

## 2024-02-15 MED ORDER — OXYCODONE HCL 5 MG PO TABS: 5.0000 mg | ORAL_TABLET | Freq: Once | ORAL | Status: DC | PRN

## 2024-02-15 MED ORDER — ONDANSETRON HCL 4 MG/2ML IJ SOLN
INTRAMUSCULAR | Status: DC | PRN
  Administered 2024-02-15: 4 mg via INTRAVENOUS

## 2024-02-15 MED ORDER — CHLORHEXIDINE GLUCONATE 0.12 % MT SOLN
OROMUCOSAL | Status: AC
  Filled 2024-02-15: qty 15

## 2024-02-15 MED ORDER — KETOROLAC TROMETHAMINE 30 MG/ML IJ SOLN
INTRAMUSCULAR | Status: AC
  Filled 2024-02-15: qty 1

## 2024-02-15 SURGICAL SUPPLY — 30 items
BLADE CLIPPER SURG (BLADE) IMPLANT
BLADE SURG 15 STRL LF DISP TIS (BLADE) ×1 IMPLANT
BRIEF MESH DISP 2XL (UNDERPADS AND DIAPERS) ×1 IMPLANT
DRAPE LAPAROTOMY 100X77 ABD (DRAPES) ×1 IMPLANT
DRSG GAUZE FLUFF 36X18 (GAUZE/BANDAGES/DRESSINGS) ×1 IMPLANT
ELECTRODE REM PT RTRN 9FT ADLT (ELECTROSURGICAL) ×1 IMPLANT
GAUZE 4X4 16PLY ~~LOC~~+RFID DBL (SPONGE) IMPLANT
GAUZE SPONGE 4X4 12PLY STRL (GAUZE/BANDAGES/DRESSINGS) ×1 IMPLANT
GLOVE BIOGEL PI IND STRL 7.5 (GLOVE) ×1 IMPLANT
GLOVE SURG SYN 7.0 PF PI (GLOVE) ×1 IMPLANT
GOWN STRL REUS W/ TWL LRG LVL3 (GOWN DISPOSABLE) ×2 IMPLANT
IV CATH ANGIO 14GX3.25 ORG (MISCELLANEOUS) IMPLANT
MANIFOLD NEPTUNE II (INSTRUMENTS) ×1 IMPLANT
NDL HYPO 22X1.5 SAFETY MO (MISCELLANEOUS) ×1 IMPLANT
NEEDLE HYPO 22X1.5 SAFETY MO (MISCELLANEOUS) ×1 IMPLANT
NS IRRIG 500ML POUR BTL (IV SOLUTION) ×1 IMPLANT
PACK BASIN MINOR ARMC (MISCELLANEOUS) ×1 IMPLANT
PAD ABD DERMACEA PRESS 5X9 (GAUZE/BANDAGES/DRESSINGS) IMPLANT
PUNCH BIOPSY DERMAL 6MM STRL (MISCELLANEOUS) IMPLANT
PUNCH BIOPSY DISP 4 (MISCELLANEOUS) IMPLANT
SOLUTION PREP PVP 2OZ (MISCELLANEOUS) ×1 IMPLANT
SPONGE VERSALON 4X4 4PLY (MISCELLANEOUS) IMPLANT
SUCTION TUBE FRAZIER 10FR DISP (SUCTIONS) IMPLANT
SUT ETHILON 2 0 FS 18 (SUTURE) IMPLANT
SUT VIC AB 2-0 CT1 (SUTURE) IMPLANT
SUT VIC AB 2-0 CT1 TAPERPNT 27 (SUTURE) IMPLANT
SYR 10ML LL (SYRINGE) ×1 IMPLANT
SYR BULB IRRIG 60ML STRL (SYRINGE) ×1 IMPLANT
TRAP FLUID SMOKE EVACUATOR (MISCELLANEOUS) ×1 IMPLANT
WATER STERILE IRR 500ML POUR (IV SOLUTION) ×1 IMPLANT

## 2024-02-15 NOTE — Anesthesia Preprocedure Evaluation (Addendum)
 Anesthesia Evaluation  Patient identified by MRN, date of birth, ID band Patient awake    Reviewed: Allergy & Precautions, H&P , NPO status , Patient's Chart, lab work & pertinent test results  Airway Mallampati: III  TM Distance: >3 FB Neck ROM: full    Dental no notable dental hx.    Pulmonary neg pulmonary ROS   Pulmonary exam normal        Cardiovascular hypertension, Normal cardiovascular exam     Neuro/Psych  PSYCHIATRIC DISORDERS Anxiety      Neuromuscular disease    GI/Hepatic negative GI ROS, Neg liver ROS,,,  Endo/Other  negative endocrine ROS    Renal/GU      Musculoskeletal   Abdominal  (+) + obese  Peds  Hematology negative hematology ROS (+)   Anesthesia Other Findings Past Medical History: No date: Anxiety 2006: GERD (gastroesophageal reflux disease) 2004: Hypertension No date: Pilonidal cyst No date: Sciatica  Past Surgical History: No date: WISDOM TOOTH EXTRACTION     Reproductive/Obstetrics negative OB ROS                              Anesthesia Physical Anesthesia Plan  ASA: 2  Anesthesia Plan: General   Post-op Pain Management: Tylenol  PO (pre-op)* and Celebrex PO (pre-op)*   Induction: Intravenous  PONV Risk Score and Plan: 2 and Ondansetron  and Dexamethasone  Airway Management Planned: Oral ETT  Additional Equipment:   Intra-op Plan:   Post-operative Plan: Extubation in OR  Informed Consent: I have reviewed the patients History and Physical, chart, labs and discussed the procedure including the risks, benefits and alternatives for the proposed anesthesia with the patient or authorized representative who has indicated his/her understanding and acceptance.     Dental Advisory Given  Plan Discussed with: CRNA and Surgeon  Anesthesia Plan Comments:          Anesthesia Quick Evaluation

## 2024-02-15 NOTE — Transfer of Care (Signed)
 Immediate Anesthesia Transfer of Care Note  Patient: Kurt Williamson  Procedure(s) Performed: EXCISION, PILONIDAL CYST, EXTENSIVE  Patient Location: PACU  Anesthesia Type:MAC  Level of Consciousness: awake, alert , and oriented  Airway & Oxygen Therapy: Patient Spontanous Breathing  Post-op Assessment: Report given to RN  Post vital signs: Reviewed and stable  Last Vitals:  Vitals Value Taken Time  BP 117/78 02/15/24 13:25  Temp 36.6 C 02/15/24 13:25  Pulse 84 02/15/24 13:28  Resp 10 02/15/24 13:28  SpO2 94 % 02/15/24 13:28  Vitals shown include unfiled device data.  Last Pain:  Vitals:   02/15/24 1011  TempSrc: Temporal  PainSc: 0-No pain         Complications: No notable events documented.

## 2024-02-15 NOTE — H&P (Signed)
 No changes to below H and P, proceed with minimally invasive pilonidal cyst excision  CC: Pilonidal Cyst Disease History of Present Illness Kurt Williamson is a 53 y.o. male with past medical history as below who presents in consultation for pilonidal cyst disease.  The patient reports that he has had recurrent abscesses and drainage in his gluteal cleft for some time.  He reports that.  Every 4 to 5 months this will recur.  He has some drainage from it.  He recently went to his dermatologist and was prescribed doxycycline  and referred to our office.  Denies any blood in stool.   Past Medical History     Past Medical History:  Diagnosis Date   Anxiety     GERD (gastroesophageal reflux disease) 2006   Hypertension 2004   Sciatica                   Past Surgical History:  Procedure Laterality Date   WISDOM TOOTH EXTRACTION              Allergies       Allergies  Allergen Reactions   Covid-19 (Mrna Bivalent) Vaccine Proofreader) [Covid-19 (Mrna) Vaccine] Palpitations      Sweating, shakes (1st dose)              Current Outpatient Medications  Medication Sig Dispense Refill   doxycycline  (ADOXA) 100 MG tablet Take 100 mg twice daily for 14 days when flared for pilonidal cyst. Take food and drink. (Patient not taking: Reported on 02/06/2024) 28 tablet 2   famotidine  (PEPCID ) 20 MG tablet Take 1 tablet (20 mg total) by mouth 2 (two) times daily as needed for heartburn or indigestion. 90 tablet 1   FIBER PO Take 3-4 capsules by mouth daily.       gabapentin  (NEURONTIN ) 300 MG capsule Take 1 capsule in the morning, 1 capsule in the afternoon, and 3 capsules at bedtime. Total 1500 mg daily. (Patient taking differently: Take 300-900 mg by mouth See admin instructions. Take 600 mg by mouth in the morning, 300 mg in the afternoon, and 900 mg at bedtime.)       metoprolol  succinate (TOPROL -XL) 50 MG 24 hr tablet TAKE 1 TABLET(50 MG) BY MOUTH DAILY WITH OR IMMEDIATELY FOLLOWING A MEAL  (Patient taking differently: Take 50 mg by mouth every evening.) 90 tablet 3   Multiple Vitamin (MULTIVITAMIN) tablet Take 1 tablet by mouth daily.       Omega-3 Fatty Acids (FISH OIL PO) Take 1 capsule by mouth daily.       Potassium Chloride  ER 20 MEQ TBCR Take 1 tablet (20 mEq total) by mouth daily. 90 tablet 3   telmisartan -hydrochlorothiazide (MICARDIS  HCT) 80-12.5 MG tablet Take 1 tablet by mouth daily. 90 tablet 3      No current facility-administered medications for this visit.        Family History      Family History  Problem Relation Age of Onset   Arthritis Father     Hypertension Father              Social History Social History  Social History        Tobacco Use   Smoking status: Never   Smokeless tobacco: Never  Vaping Use   Vaping status: Never Used  Substance Use Topics   Alcohol use: Not Currently   Drug use: Never            ROS Full ROS of  systems performed and is otherwise negative there than what is stated in the HPI   Physical Exam Blood pressure 126/89, pulse 77, height 6' 2 (1.88 m), weight 298 lb (135.2 kg), SpO2 97%.   Alert and oriented x 3, normal nondistended, gluteal cleft exam performed in the presence of a chaperone.  He has evidence of previous abscesses and several pits disease.   Data Reviewed I reviewed his notes from his dermatologist though consistent with pilonidal cyst disease.   I have personally reviewed the patient's imaging and medical records.     Assessment Assessment Patient with pilonidal cyst disease   Plan Plan I discussed the treatment of pilonidal cyst disease.  We also discussed the pathophysiology of this disease.  We talked about the different options for surgical intervention to include a minimally invasive approach such as a gipps procedure, wide local excision and then different flap reconstructions.  I discussed with him that I would start with a gipps procedure due to his minimally invasive nature  and success.  I discussed with him that the biggest risk of the surgery is risk of recurrence.  If it does recur then we can talk about the need for level excision of the disease.  I discussed other risk, benefits and alternatives including risk of infection, bleeding and cosmetic outcomes that are poor.  He understands all these risks and wishes to proceed with surgery.   A total of 45 minutes was spent reviewing the patient's chart, performing history and physical and discussing treatment options with the patient.       [name]

## 2024-02-15 NOTE — Anesthesia Postprocedure Evaluation (Signed)
 Anesthesia Post Note  Patient: Kurt Williamson  Procedure(s) Performed: EXCISION, PILONIDAL CYST, EXTENSIVE  Patient location during evaluation: PACU Anesthesia Type: General Level of consciousness: awake and alert Pain management: pain level controlled Vital Signs Assessment: post-procedure vital signs reviewed and stable Respiratory status: spontaneous breathing, nonlabored ventilation and respiratory function stable Cardiovascular status: blood pressure returned to baseline and stable Postop Assessment: no apparent nausea or vomiting Anesthetic complications: no   No notable events documented.   Last Vitals:  Vitals:   02/15/24 1357 02/15/24 1412  BP: 107/65 111/74  Pulse: 66 65  Resp: 12 15  Temp:    SpO2: 95% 99%    Last Pain:  Vitals:   02/15/24 1412  TempSrc:   PainSc: 0-No pain                 Camellia Merilee Louder

## 2024-02-15 NOTE — Op Note (Signed)
 Operative note  Preoperative diagnosis: Pilonidal cyst disease Postoperative diagnosis: Pilonidal cyst disease Surgeon: Jayson Hobby, MD EBL: 5 cc Procedure: Excision of pilonidal cyst disease   After informed consent was obtained the patient was brought to the operating room and was placed in the prone position on the operating room table.  Monitored anesthesia care was then induced.  His buttocks and gluteal cleft were then prepped and draped in usual sterile fashion.  He had a history of pilonidal cyst abscess as well as several pits inferior to this.  Using a 6 mm punch biopsy the pits were cored out.  The underlying tissue was curetted to clear any debris.  There was return of clumps of hair.  This did track to the site of previous abscess.  Once all pits were excised the subcutaneous tissue was irrigated with warm saline solution.  The surrounding skin was infiltrated with 10 cc of half percent lidocaine with epinephrine.  A piece of gauze was placed in the wound bed.  It was covered with gauze and ABD pads.  The patient was then transferred to the stretcher and awoken from monitored anesthesia care.  Prior to termination of procedure all sponge instrument counts were correct x 2.

## 2024-02-16 ENCOUNTER — Encounter: Payer: Self-pay | Admitting: General Surgery

## 2024-02-18 ENCOUNTER — Telehealth: Payer: Self-pay

## 2024-02-18 NOTE — Telephone Encounter (Signed)
 Spoke with patient and he stated the H.Pylori test was on hold for now-he stated he discussed with Robin.

## 2024-02-27 NOTE — Progress Notes (Unsigned)
 02/28/2024 RUDI BUNYARD [id number] 03-01-1971  Gastroenterology Office Note    Referring Provider: Manya Toribio SQUIBB, PA Primary Care Physician:  Manya Toribio SQUIBB, PA  Primary GI Provider: Jinny Carmine, MD    Chief Complaint   Chief Complaint  Patient presents with   Follow-up    Thalia has improved since taking pepcid .     History of Present Illness   RENAUD CELLI is a 53 y.o. male presenting today for 4 week follow up for right upper quadrant abdominal pain, nausea, GERD, and dyspepsia.  Patient seen by myself on 01/31/2024 for RUQ abd pain, nausea, and GERD. Amylase and lipase WNL.  Patient reports that since last visit he cut out ginger ale soda and coffee with great improvement in the symptoms he was having.  He states nausea has resolved and he is no longer belching.   Patient states that episodes of right upper quadrant pain are less frequent.  States no longer having actual pain but occasionally may feel some mild tightness on the right side.  States that on Sunday he felt this after he ate and it only lasted for about 10 minutes.    02/12/2024 HIDA scan unremarkable. Gallbladder ejection fraction is 67%.  12/21/2023 US  abdomen right upper quadrant IMPRESSION: Diffuse increased echogenicity of the hepatic parenchyma is a nonspecific indicator of hepatocellular dysfunction, most commonly steatosis.   Past Medical History:  Diagnosis Date   Anxiety    GERD (gastroesophageal reflux disease) 2006   Take Prilosec   Hypertension 2004   Dad also was hypertensive.   Pilonidal cyst    Sciatica     Past Surgical History:  Procedure Laterality Date   PILONIDAL CYST EXCISION N/A 02/15/2024   Procedure: EXCISION, PILONIDAL CYST, EXTENSIVE;  Surgeon: Marinda Jayson KIDD, MD;  Location: [facility] ORS;  Service: General;  Laterality: N/A;   WISDOM TOOTH EXTRACTION      Current Outpatient Medications  Medication Sig Dispense Refill   famotidine  (PEPCID ) 20 MG  tablet Take 1 tablet (20 mg total) by mouth 2 (two) times daily. 90 tablet 1   FIBER PO Take 3-4 capsules by mouth daily.     gabapentin  (NEURONTIN ) 300 MG capsule Take 1 capsule in the morning, 1 capsule in the afternoon, and 3 capsules at bedtime. Total 1500 mg daily. (Patient taking differently: Take 300-900 mg by mouth See admin instructions. Take 600 mg by mouth in the morning, 300 mg in the afternoon, and 900 mg at bedtime.)     metoprolol  succinate (TOPROL -XL) 50 MG 24 hr tablet TAKE 1 TABLET(50 MG) BY MOUTH DAILY WITH OR IMMEDIATELY FOLLOWING A MEAL (Patient taking differently: Take 50 mg by mouth every evening.) 90 tablet 3   Multiple Vitamin (MULTIVITAMIN) tablet Take 1 tablet by mouth daily.     Omega-3 Fatty Acids (FISH OIL PO) Take 1 capsule by mouth daily.     Potassium Chloride  ER 20 MEQ TBCR Take 1 tablet (20 mEq total) by mouth daily. 90 tablet 3   telmisartan -hydrochlorothiazide (MICARDIS  HCT) 80-12.5 MG tablet Take 1 tablet by mouth daily. 90 tablet 3   No current facility-administered medications for this visit.    Allergies as of 02/28/2024 - Review Complete 02/28/2024  Allergen Reaction Noted   Covid-19 (mrna bivalent) vaccine (pfizer) [covid-19 (mrna) vaccine] Palpitations 02/06/2024    Family History  Problem Relation Age of Onset   Arthritis Father    Hypertension Father     Social History   Socioeconomic History  Marital status: Significant Other    Spouse name: Not on file   Number of children: 0   Years of education: Not on file   Highest education level: Bachelor's degree (e.g., BA, AB, BS)  Occupational History   Not on file  Tobacco Use   Smoking status: Never   Smokeless tobacco: Never  Vaping Use   Vaping status: Never Used  Substance and Sexual Activity   Alcohol use: Not Currently   Drug use: Never   Sexual activity: Yes    Birth control/protection: Condom, None  Other Topics Concern   Not on file  Social History Narrative   Not on  file   Social Drivers of Health   Financial Resource Strain: Low Risk  (12/13/2023)   Overall Financial Resource Strain (CARDIA)    Difficulty of Paying Living Expenses: Not very hard  Food Insecurity: No Food Insecurity (12/13/2023)   Hunger Vital Sign    Worried About Running Out of Food in the Last Year: Never true    Ran Out of Food in the Last Year: Never true  Transportation Needs: No Transportation Needs (12/13/2023)   PRAPARE - Administrator, Civil Service (Medical): No    Lack of Transportation (Non-Medical): No  Physical Activity: Insufficiently Active (12/13/2023)   Exercise Vital Sign    Days of Exercise per Week: 2 days    Minutes of Exercise per Session: 30 min  Stress: No Stress Concern Present (12/13/2023)   [employer] of Occupational Health - Occupational Stress Questionnaire    Feeling of Stress: Only a little  Social Connections: Socially Isolated (12/13/2023)   Social Connection and Isolation Panel    Frequency of Communication with Friends and Family: Once a week    Frequency of Social Gatherings with Friends and Family: Once a week    Attends Religious Services: Never    Database Administrator or Organizations: No    Attends Engineer, Structural: Not on file    Marital Status: Living with partner  Intimate Partner Violence: Not At Risk (12/13/2023)   Humiliation, Afraid, Rape, and Kick questionnaire    Fear of Current or Ex-Partner: No    Emotionally Abused: No    Physically Abused: No    Sexually Abused: No     RELEVANT GI HISTORY, IMAGING AND LABS: CBC    Component Value Date/Time   WBC 8.0 12/14/2023 1006   WBC 8.4 12/03/2021 0853   RBC 5.09 12/14/2023 1006   RBC 5.41 12/03/2021 0853   HGB 15.5 12/14/2023 1006   HCT 45.6 12/14/2023 1006   PLT 216 12/14/2023 1006   MCV 90 12/14/2023 1006   MCH 30.5 12/14/2023 1006   MCH 29.2 12/03/2021 0853   MCHC 34.0 12/14/2023 1006   MCHC 33.5 12/03/2021 0853   RDW 12.7 12/14/2023  1006   LYMPHSABS 2.8 12/14/2023 1006   EOSABS 0.3 12/14/2023 1006   BASOSABS 0.1 12/14/2023 1006   Recent Labs    12/14/23 1006  HGB 15.5    CMP     Component Value Date/Time   NA 139 12/14/2023 1006   K 4.5 12/14/2023 1006   CL 102 12/14/2023 1006   CO2 22 12/14/2023 1006   GLUCOSE 86 12/14/2023 1006   GLUCOSE 128 (H) 12/03/2021 0853   BUN 17 12/14/2023 1006   CREATININE 1.29 (H) 12/14/2023 1006   CREATININE 1.05 01/03/2021 0935   CALCIUM 9.5 12/14/2023 1006   PROT 7.4 12/14/2023 1006   ALBUMIN  4.6 12/14/2023 1006   AST 19 12/14/2023 1006   ALT 21 12/14/2023 1006   ALKPHOS 82 12/14/2023 1006   BILITOT 0.9 12/14/2023 1006   GFRNONAA >60 12/03/2021 0853   GFRAA >60 07/11/2019 1509      Latest Ref Rng & Units 12/14/2023   10:06 AM 10/20/2022    4:38 PM 07/24/2022    8:19 AM  Hepatic Function  Total Protein 6.0 - 8.5 g/dL 7.4  8.0  7.3   Albumin 3.8 - 4.9 g/dL 4.6  5.0  4.6   AST 0 - 40 IU/L 19  18  12    ALT 0 - 44 IU/L 21  24  18    Alk Phosphatase 44 - 121 IU/L 82  95  64   Total Bilirubin 0.0 - 1.2 mg/dL 0.9  0.6  0.9       Review of Systems   All systems reviewed and negative except where noted in HPI.    Physical Exam  BP 121/71   Pulse 74   Temp 98.6 F (37 C)   Ht 6' 2 (1.88 m)   Wt (!) 301 lb 3.2 oz (136.6 kg)   SpO2 95%   BMI 38.67 kg/m  No LMP for male patient. General:   Alert and oriented. Pleasant and cooperative. Well-nourished and well-developed.  Head:  Normocephalic and atraumatic. Eyes:  Without icterus Ears:  Normal auditory acuity. Abdomen:  Normal bowel sounds.  No bruits.  Soft, non-tender and non-distended without masses, hepatosplenomegaly or hernias noted.  No guarding or rebound tenderness.   Rectal:  Deferred. Msk:  Symmetrical without gross deformities. Normal posture. Extremities:  Without edema. Neurologic:  Alert and  oriented x4;  grossly normal neurologically. Skin:  Intact without significant lesions or  rashes. Psych:  Alert and cooperative. Normal mood and affect.   Assessment & Plan   SANG BLOUNT is a 53 y.o. male presenting today for follow up on RUQ abdominal pain, nausea, GERD, and dyspepsia.   RUQ ABD Pain.  Symptoms have improved and having less frequent episodes.  May have feelings of tightness after he eats that lasts for about 10 minutes. RUQ quadrant ultrasound and HIDA scan unremarkable.  Will monitor symptoms.  GERD with associated dyspepsia.  Symptoms have improved with eliminating caffeine and soda.  Nausea has resolved. - Continue lifestyle modifications - Refilled famotidine  twice a day.  Instructed patient that he can try only taking it once a day to see how he does. - Keep scheduled EGD/colon on 04/21/2024  Follow-up in 2 months for further recommendations pending EGD  Grayce Bohr, DNP, AGNP-C [facility]

## 2024-02-28 ENCOUNTER — Encounter: Payer: Self-pay | Admitting: General Surgery

## 2024-02-28 ENCOUNTER — Encounter: Payer: Self-pay | Admitting: Family Medicine

## 2024-02-28 ENCOUNTER — Ambulatory Visit: Admitting: Family Medicine

## 2024-02-28 ENCOUNTER — Ambulatory Visit (INDEPENDENT_AMBULATORY_CARE_PROVIDER_SITE_OTHER): Admitting: General Surgery

## 2024-02-28 VITALS — BP 130/86 | HR 66 | Temp 98.6°F | Ht 74.0 in | Wt 301.0 lb

## 2024-02-28 VITALS — BP 121/71 | HR 74 | Temp 98.6°F | Ht 74.0 in | Wt 301.2 lb

## 2024-02-28 DIAGNOSIS — R1011 Right upper quadrant pain: Secondary | ICD-10-CM | POA: Diagnosis not present

## 2024-02-28 DIAGNOSIS — L0591 Pilonidal cyst without abscess: Secondary | ICD-10-CM

## 2024-02-28 DIAGNOSIS — K219 Gastro-esophageal reflux disease without esophagitis: Secondary | ICD-10-CM | POA: Diagnosis not present

## 2024-02-28 MED ORDER — FAMOTIDINE 20 MG PO TABS: 20.0000 mg | ORAL_TABLET | Freq: Two times a day (BID) | ORAL | 1 refills | Status: AC

## 2024-02-28 NOTE — Patient Instructions (Signed)

## 2024-03-06 NOTE — Progress Notes (Signed)
 Outpatient Surgical Follow Up    Kurt Williamson is an 53 y.o. male.   Chief Complaint  Patient presents with   Routine Post Op    HPI: Patient is status post pilonidal cyst excision.  He reports doing well.  He says that he had a initially little bit of drainage from the area but it is healing up.  He denies any pain.  He is very happy with the results.  Past Medical History:  Diagnosis Date   Anxiety    GERD (gastroesophageal reflux disease) 2006   Take Prilosec   Hypertension 2004   Dad also was hypertensive.   Pilonidal cyst    Sciatica     Past Surgical History:  Procedure Laterality Date   PILONIDAL CYST EXCISION N/A 02/15/2024   Procedure: EXCISION, PILONIDAL CYST, EXTENSIVE;  Surgeon: Marinda Jayson KIDD, MD;  Location: [facility] ORS;  Service: General;  Laterality: N/A;   WISDOM TOOTH EXTRACTION      Family History  Problem Relation Age of Onset   Arthritis Father    Hypertension Father     Social History:  reports that he has never smoked. He has never used smokeless tobacco. He reports that he does not currently use alcohol. He reports that he does not use drugs.  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    ROS Full ROS performed and is otherwise negative other than what is stated in HPI   BP 130/86   Pulse 66   Temp 98.6 F (37 C) (Oral)   Ht 6' 2 (1.88 m)   Wt (!) 301 lb (136.5 kg)   SpO2 95%   BMI 38.65 kg/m   Physical Exam  Surgical site where the excision was done is healing well with good granulation tissue.  There is no evidence of reopening of any further pits.  Assessment/Plan: Patient status post pilonidal cyst excision.  It is healing in well and there is no evidence of recurrence of disease.  Patient can follow-up as needed  Jayson Marinda, M.D. [facility]

## 2024-03-12 NOTE — Telephone Encounter (Signed)
 Please review.  KP

## 2024-03-31 ENCOUNTER — Encounter: Payer: Self-pay | Admitting: Family Medicine

## 2024-03-31 ENCOUNTER — Other Ambulatory Visit: Payer: Self-pay

## 2024-03-31 MED ORDER — NA SULFATE-K SULFATE-MG SULF 17.5-3.13-1.6 GM/177ML PO SOLN: 1.0000 | Freq: Once | ORAL | 0 refills | Status: AC

## 2024-04-09 SURGERY — EGD (ESOPHAGOGASTRODUODENOSCOPY)
Anesthesia: General

## 2024-04-10 ENCOUNTER — Encounter: Payer: Self-pay | Admitting: Gastroenterology

## 2024-04-10 ENCOUNTER — Encounter: Admitting: General Surgery

## 2024-04-10 NOTE — Anesthesia Preprocedure Evaluation (Addendum)
"                                    Anesthesia Evaluation  Patient identified by MRN, date of birth, ID band Patient awake    Reviewed: Allergy & Precautions, H&P , NPO status , Patient's Chart, lab work & pertinent test results  Airway Mallampati: III  TM Distance: >3 FB Neck ROM: Full    Dental no notable dental hx.    Pulmonary neg pulmonary ROS   Pulmonary exam normal breath sounds clear to auscultation       Cardiovascular hypertension, negative cardio ROS Normal cardiovascular exam Rhythm:Regular Rate:Normal     Neuro/Psych   Anxiety      Neuromuscular disease negative neurological ROS  negative psych ROS   GI/Hepatic negative GI ROS, Neg liver ROS,GERD  ,,  Endo/Other  negative endocrine ROS    Renal/GU negative Renal ROS  negative genitourinary   Musculoskeletal negative musculoskeletal ROS (+)    Abdominal   Peds negative pediatric ROS (+)  Hematology negative hematology ROS (+)   Anesthesia Other Findings Medical History  Hypertension  Anxiety GERD (gastroesophageal reflux disease)  Sciatica Pilonidal cyst     Reproductive/Obstetrics negative OB ROS                              Anesthesia Physical Anesthesia Plan  ASA: 2  Anesthesia Plan: General   Post-op Pain Management:    Induction: Intravenous  PONV Risk Score and Plan:   Airway Management Planned: Natural Airway and Nasal Cannula  Additional Equipment:   Intra-op Plan:   Post-operative Plan:   Informed Consent: I have reviewed the patients History and Physical, chart, labs and discussed the procedure including the risks, benefits and alternatives for the proposed anesthesia with the patient or authorized representative who has indicated his/her understanding and acceptance.     Dental Advisory Given  Plan Discussed with: Anesthesiologist, CRNA and Surgeon  Anesthesia Plan Comments: (Patient consented for risks of anesthesia  including but not limited to:  - adverse reactions to medications - risk of airway placement if required - damage to eyes, teeth, lips or other oral mucosa - nerve damage due to positioning  - sore throat or hoarseness - Damage to heart, brain, nerves, lungs, other parts of body or loss of life  Patient voiced understanding and assent.)         Anesthesia Quick Evaluation  "

## 2024-04-21 ENCOUNTER — Ambulatory Visit: Payer: Self-pay | Admitting: Anesthesiology

## 2024-04-21 ENCOUNTER — Encounter: Payer: Self-pay | Admitting: Gastroenterology

## 2024-04-21 ENCOUNTER — Encounter
Admission: RE | Disposition: A | Discharge status: Home / Self Care | Payer: Self-pay | Source: Home / Self Care | Attending: Gastroenterology

## 2024-04-21 ENCOUNTER — Ambulatory Visit
Admission: RE | Admit: 2024-04-21 | Discharge: 2024-04-21 | Disposition: A | Discharge status: Home / Self Care | Attending: Gastroenterology | Admitting: Gastroenterology

## 2024-04-21 ENCOUNTER — Other Ambulatory Visit: Payer: Self-pay

## 2024-04-21 DIAGNOSIS — K3189 Other diseases of stomach and duodenum: Secondary | ICD-10-CM | POA: Insufficient documentation

## 2024-04-21 DIAGNOSIS — K319 Disease of stomach and duodenum, unspecified: Secondary | ICD-10-CM | POA: Insufficient documentation

## 2024-04-21 DIAGNOSIS — K640 First degree hemorrhoids: Secondary | ICD-10-CM | POA: Diagnosis not present

## 2024-04-21 DIAGNOSIS — Z1211 Encounter for screening for malignant neoplasm of colon: Secondary | ICD-10-CM | POA: Diagnosis present

## 2024-04-21 DIAGNOSIS — K5730 Diverticulosis of large intestine without perforation or abscess without bleeding: Secondary | ICD-10-CM | POA: Insufficient documentation

## 2024-04-21 DIAGNOSIS — I10 Essential (primary) hypertension: Secondary | ICD-10-CM | POA: Insufficient documentation

## 2024-04-21 DIAGNOSIS — K219 Gastro-esophageal reflux disease without esophagitis: Secondary | ICD-10-CM

## 2024-04-21 DIAGNOSIS — K2100 Gastro-esophageal reflux disease with esophagitis, without bleeding: Secondary | ICD-10-CM | POA: Insufficient documentation

## 2024-04-21 DIAGNOSIS — R1013 Epigastric pain: Secondary | ICD-10-CM

## 2024-04-21 DIAGNOSIS — K635 Polyp of colon: Secondary | ICD-10-CM | POA: Diagnosis not present

## 2024-04-21 HISTORY — PX: COLONOSCOPY: SHX5424

## 2024-04-21 HISTORY — PX: ESOPHAGOGASTRODUODENOSCOPY: SHX5428

## 2024-04-21 HISTORY — PX: POLYPECTOMY: SHX149

## 2024-04-21 SURGERY — COLONOSCOPY
Anesthesia: General | Site: Rectum

## 2024-04-21 MED ORDER — STERILE WATER FOR IRRIGATION IR SOLN
Status: DC | PRN
  Administered 2024-04-21 (×2): 250 mL

## 2024-04-21 MED ORDER — LACTATED RINGERS IV SOLN: INTRAVENOUS | Status: DC

## 2024-04-21 MED ORDER — ONDANSETRON HCL 4 MG/2ML IJ SOLN
INTRAMUSCULAR | Status: AC
  Filled 2024-04-21: qty 2

## 2024-04-21 MED ORDER — SODIUM CHLORIDE 0.9 % IV SOLN: INTRAVENOUS | Status: DC

## 2024-04-21 MED ORDER — PROPOFOL 10 MG/ML IV BOLUS
INTRAVENOUS | Status: DC | PRN
  Administered 2024-04-21: 200 mg via INTRAVENOUS
  Administered 2024-04-21: 160 ug/kg/min via INTRAVENOUS

## 2024-04-21 MED ORDER — PROPOFOL 1000 MG/100ML IV EMUL
INTRAVENOUS | Status: AC
  Filled 2024-04-21: qty 100

## 2024-04-21 MED ORDER — GLYCOPYRROLATE 0.2 MG/ML IJ SOLN
INTRAMUSCULAR | Status: AC
  Filled 2024-04-21: qty 1

## 2024-04-21 SURGICAL SUPPLY — 20 items
CLIP HMST 235XBRD CATH ROT (MISCELLANEOUS) IMPLANT
ELECTRODE REM PT RTRN 9FT ADLT (ELECTROSURGICAL) IMPLANT
FORCEPS BIOP RAD 4 LRG CAP 4 (CUTTING FORCEPS) IMPLANT
GAUZE SPONGE 4X4 12PLY STRL (GAUZE/BANDAGES/DRESSINGS) IMPLANT
GOWN CVR UNV OPN BCK APRN NK (MISCELLANEOUS) ×6 IMPLANT
INJECTOR VARIJECT VIN23 (MISCELLANEOUS) IMPLANT
KIT DEFENDO VALVE AND CONN (KITS) IMPLANT
KIT PROCEDURE OLYMPUS (MISCELLANEOUS) ×3 IMPLANT
MANIFOLD NEPTUNE II (INSTRUMENTS) ×3 IMPLANT
MARKER SPOT ENDO TATTOO 5ML (MISCELLANEOUS) IMPLANT
PROBE APC STR FIRE (PROBE) IMPLANT
RETRIEVER NET ROTH 2.5X230 LF (MISCELLANEOUS) IMPLANT
SNARE COLD EXACTO (MISCELLANEOUS) IMPLANT
SNARE LASSO HEX 3 IN 1 (INSTRUMENTS) IMPLANT
SNARE SHORT THROW 13M SML OVAL (MISCELLANEOUS) IMPLANT
SNARE SNG USE RND 15MM (INSTRUMENTS) IMPLANT
STRAP BODY AND KNEE 60X3 (MISCELLANEOUS) IMPLANT
SYR 50ML SLIP (SYRINGE) IMPLANT
TRAP ETRAP POLY (MISCELLANEOUS) IMPLANT
WATER STERILE IRR 250ML POUR (IV SOLUTION) ×3 IMPLANT

## 2024-04-21 NOTE — Op Note (Signed)
 [facility] Gastroenterology Patient Name: Kurt Williamson Procedure Date: 04/21/2024 9:52 AM MRN: [id number] Account #: [id number] Date of Birth: October 18, 1970 Admit Type: Outpatient Age: 53 Room: [facility] OR ROOM 01 Gender: Male Note Status: Finalized Instrument Name: Endoscope [id number] Procedure:             Upper GI endoscopy Indications:           New-onset upper abdominal symptoms in patient older                         than 50 years Providers:             Clotilda Schaffer, MD Referring MD:          Toribio SQUIBB. Waddell (Referring MD) Medicines:             Propofol  per Anesthesia Complications:         No immediate complications. Procedure:             Pre-Anesthesia Assessment:                        - Prior to the procedure, a History and Physical was                         performed, and patient medications and allergies were                         reviewed. The patient's tolerance of previous                         anesthesia was also reviewed. The risks and benefits                         of the procedure and the sedation options and risks                         were discussed with the patient. All questions were                         answered, and informed consent was obtained. Prior                         Anticoagulants: The patient has taken no anticoagulant                         or antiplatelet agents. ASA Grade Assessment: II - A                         patient with mild systemic disease. After reviewing                         the risks and benefits, the patient was deemed in                         satisfactory condition to undergo the procedure.                        After obtaining informed consent, the endoscope was  passed under direct vision. Throughout the procedure,                         the patient's blood pressure, pulse, and oxygen                         saturations were monitored continuously. The Endoscope                          was introduced through the mouth, and advanced to the                         third part of duodenum. The upper GI endoscopy was                         accomplished without difficulty. The patient tolerated                         the procedure well. Findings:      LA Grade C (one or more mucosal breaks continuous between tops of 2 or       more mucosal folds, less than 75% circumference) esophagitis with no       bleeding was found in the distal esophagus.      A single 4 mm mucosal papule (nodule) with no bleeding and no stigmata       of recent bleeding was found in the gastric antrum. Biopsies were taken       with a cold forceps for histology. Estimated blood loss: none.      The exam of the stomach was otherwise normal.      Biopsies were taken with a cold forceps in the stomach for histology.      The examined duodenum was normal. Biopsies for histology were taken with       a cold forceps for evaluation of celiac disease. Estimated blood loss:       none. Impression:            - LA Grade C reflux esophagitis with no bleeding.                        - A single mucosal papule (nodule) found in the                         stomach. Biopsied.                        - Normal examined duodenum. Biopsied.                        - Biopsies were taken with a cold forceps for                         histology. Recommendation:        - Patient has a contact number available for                         emergencies. The signs and symptoms of potential                         delayed complications  were discussed with the patient.                         Return to normal activities tomorrow. Written                         discharge instructions were provided to the patient.                        - GERD prevention diet.                        - Continue present medications.                        - Await pathology results.                        - The findings and  recommendations were discussed with                         the designated responsible adult. Procedure Code(s):     --- Professional ---                        [phone], Esophagogastroduodenoscopy, flexible,                         transoral; with biopsy, single or multiple Diagnosis Code(s):     --- Professional ---                        K21.00, Gastro-esophageal reflux disease with                         esophagitis, without bleeding                        K31.89, Other diseases of stomach and duodenum                        R19.8, Other specified symptoms and signs involving                         the digestive system and abdomen CPT copyright 2022 American Medical Association. All rights reserved. The codes documented in this report are preliminary and upon coder review may  be revised to meet current compliance requirements. Clotilda Schaffer, MD 04/21/2024 10:23:48 AM Number of Addenda: 0 Note Initiated On: 04/21/2024 9:52 AM Scope Withdrawal Time: 0 hours 0 minutes 1 second  Total Procedure Duration: 0 hours 9 minutes 16 seconds  Estimated Blood Loss:  Estimated blood loss: none.      [facility]

## 2024-04-21 NOTE — H&P (Signed)
 "  Kurt Schaffer, MD [facility] [address], [address] [place], [place] [address] Phone:[phone] Fax : [phone]  Primary Care Physician:  Kurt Williamson, [place] Primary Gastroenterologist:  Dr. Schaffer  Pre-Procedure History & Physical: HPI:  Kurt Williamson is a 53 y.o. male is here for an endoscopy and colonoscopy.  Prior procedure? No Fhx CRC? No Blood thinners? No   Past Medical History:  Diagnosis Date   Anxiety    GERD (gastroesophageal reflux disease) 2006   Take Prilosec   Hypertension 2004   Dad also was hypertensive.   Pilonidal cyst    Sciatica     Past Surgical History:  Procedure Laterality Date   PILONIDAL CYST EXCISION N/A 02/15/2024   Procedure: EXCISION, PILONIDAL CYST, EXTENSIVE;  Surgeon: Kurt Jayson KIDD, MD;  Location: [facility] ORS;  Service: General;  Laterality: N/A;   WISDOM TOOTH EXTRACTION      Prior to Admission medications  Medication Sig Start Date End Date Taking? Authorizing Provider  Ascorbic Acid (VITAMIN C) 1000 MG tablet Take 1,000 mg by mouth daily.   Yes [provider]  famotidine  (PEPCID ) 20 MG tablet Take 1 tablet (20 mg total) by mouth 2 (two) times daily. 02/28/24  Yes Kurt Rima, NP  FIBER PO Take 3-4 capsules by mouth daily.   Yes [provider]  gabapentin  (NEURONTIN ) 300 MG capsule Take 1 capsule in the morning, 1 capsule in the afternoon, and 3 capsules at bedtime. Total 1500 mg daily. Patient taking differently: Take 300-900 mg by mouth See admin instructions. Take 600 mg by mouth in the morning, 300 mg in the afternoon, and 900 mg at bedtime. 01/30/23  Yes Kurt Williamson, Kurt SQUIBB, PA  metoprolol  succinate (TOPROL -XL) 50 MG 24 hr tablet TAKE 1 TABLET(50 MG) BY MOUTH DAILY WITH OR IMMEDIATELY FOLLOWING A MEAL Patient taking differently: Take 50 mg by mouth every evening. 12/13/23  Yes Kurt Kurt SQUIBB, PA  Multiple Vitamin (MULTIVITAMIN) tablet Take 1 tablet by mouth daily.   Yes [provider]  Omega-3  Fatty Acids (FISH OIL PO) Take 1 capsule by mouth daily.   Yes [provider]  Potassium Chloride  ER 20 MEQ TBCR Take 1 tablet (20 mEq total) by mouth daily. 12/13/23  Yes Kurt Kurt SQUIBB, PA  telmisartan -hydrochlorothiazide (MICARDIS  HCT) 80-12.5 MG tablet Take 1 tablet by mouth daily. 12/13/23  Yes Kurt Kurt SQUIBB, PA    Allergies as of 02/04/2024   (No Known Allergies)    Family History  Problem Relation Age of Onset   Arthritis Father    Hypertension Father     Social History   Socioeconomic History   Marital status: Significant Other    Spouse name: Not on file   Number of children: 0   Years of education: Not on file   Highest education level: Bachelor's degree (e.g., BA, AB, BS)  Occupational History   Not on file  Tobacco Use   Smoking status: Never   Smokeless tobacco: Never  Vaping Use   Vaping status: Never Used  Substance and Sexual Activity   Alcohol use: Not Currently   Drug use: Never   Sexual activity: Yes    Birth control/protection: Condom, None  Other Topics Concern   Not on file  Social History Narrative   Not on file   Social Drivers of Health   Tobacco Use: Low Risk (04/21/2024)   Patient History    Smoking Tobacco Use: Never    Smokeless Tobacco Use: Never  Passive Exposure: Not on file  Financial Resource Strain: Low Risk (12/13/2023)   Overall Financial Resource Strain (CARDIA)    Difficulty of Paying Living Expenses: Not very hard  Food Insecurity: No Food Insecurity (12/13/2023)   Epic    Worried About Programme Researcher, Broadcasting/film/video in the Last Year: Never true    Ran Out of Food in the Last Year: Never true  Transportation Needs: No Transportation Needs (12/13/2023)   Epic    Lack of Transportation (Medical): No    Lack of Transportation (Non-Medical): No  Physical Activity: Insufficiently Active (12/13/2023)   Exercise Vital Sign    Days of Exercise per Week: 2 days    Minutes of Exercise per Session: 30 min  Stress: No Stress  Concern Present (12/13/2023)   Kurt Williamson of Occupational Health - Occupational Stress Questionnaire    Feeling of Stress: Only a little  Social Connections: Socially Isolated (12/13/2023)   Social Connection and Isolation Panel    Frequency of Communication with Friends and Family: Once a week    Frequency of Social Gatherings with Friends and Family: Once a week    Attends Religious Services: Never    Database Administrator or Organizations: No    Attends Engineer, Structural: Not on file    Marital Status: Living with partner  Intimate Partner Violence: Not At Risk (12/13/2023)   Epic    Fear of Current or Ex-Partner: No    Emotionally Abused: No    Physically Abused: No    Sexually Abused: No  Depression (PHQ2-9): Low Risk (12/13/2023)   Depression (PHQ2-9)    PHQ-2 Score: 0  Alcohol Screen: Not on file  Housing: Low Risk (12/13/2023)   Epic    Unable to Pay for Housing in the Last Year: No    Number of Times Moved in the Last Year: 0    Homeless in the Last Year: No  Utilities: Not At Risk (12/13/2023)   Epic    Threatened with loss of utilities: No  Health Literacy: Not on file    Review of Systems: See HPI, otherwise negative ROS  Physical Exam: Ht 6' 2 (1.88 m)   Wt 127 kg   BMI 35.95 kg/m  CONSTITUTIONAL: Well-appearing in no acute distress.  HEENT: Pupils equal, round, Extraocular movements intact. Conjunctivae clear NECK: Neck supple CARDIOVASCULAR: Regular rate, no LE edema  RESPIRATORY: No labored breathing  ABDOMEN: Abdomen soft, nontender, not distended, no guarding, no rigidity SKIN: No apparent skin rashes or lesions. NEUROLOGIC: Normal speech, no focal findings. Mental status alert and oriented x4. PSYCHIATRIC: Mood and affect normal.   Impression/Plan: [name] is here for an endoscopy and colonoscopy to be performed for GERD/RUQ tightness/dyspepsia and colonoscopy for average risk screening.  Risks, benefits, limitations, and  alternatives regarding  endoscopy and colonoscopy have been reviewed with the patient.  Questions have been answered.  All parties agreeable.   [name], MD  04/21/2024, 9:52 AM  "

## 2024-04-21 NOTE — Transfer of Care (Signed)
 Immediate Anesthesia Transfer of Care Note  Patient: Kurt Williamson  Procedure(s) Performed: COLONOSCOPY (Rectum) EGD (ESOPHAGOGASTRODUODENOSCOPY) (Mouth) POLYPECTOMY, INTESTINE (Rectum)  Patient Location: PACU  Anesthesia Type: General  Level of Consciousness: awake, alert  and patient cooperative  Airway and Oxygen Therapy: Patient Spontanous Breathing   Post-op Assessment: Post-op Vital signs reviewed, Patient's Cardiovascular Status Stable, Respiratory Function Stable, Patent Airway and No signs of Nausea or vomiting  Post-op Vital Signs: Reviewed and stable  Complications: No notable events documented.

## 2024-04-21 NOTE — Op Note (Signed)
 [facility] Gastroenterology Patient Name: Kurt Williamson Procedure Date: 04/21/2024 9:51 AM MRN: [id number] Account #: [id number] Date of Birth: 1971-04-13 Admit Type: Outpatient Age: 53 Room: [facility] OR ROOM 01 Gender: Male Note Status: Finalized Instrument Name: Colonoscope [id number] Procedure:             Colonoscopy Indications:           Screening for colorectal malignant neoplasm Providers:             Clotilda Schaffer, MD Referring MD:          Toribio SQUIBB. Waddell (Referring MD) Medicines:             Propofol  per Anesthesia Complications:         No immediate complications. Procedure:             Pre-Anesthesia Assessment:                        - Prior to the procedure, a History and Physical was                         performed, and patient medications and allergies were                         reviewed. The patient's tolerance of previous                         anesthesia was also reviewed. The risks and benefits                         of the procedure and the sedation options and risks                         were discussed with the patient. All questions were                         answered, and informed consent was obtained. Prior                         Anticoagulants: The patient has taken no anticoagulant                         or antiplatelet agents. ASA Grade Assessment: II - A                         patient with mild systemic disease. After reviewing                         the risks and benefits, the patient was deemed in                         satisfactory condition to undergo the procedure.                        After obtaining informed consent, the colonoscope was                         passed under direct vision. Throughout the procedure,  the patient's blood pressure, pulse, and oxygen                         saturations were monitored continuously. The                         Colonoscope was introduced through  the anus and                         advanced to the 10 cm into the ileum. The terminal                         ileum, ileocecal valve, appendiceal orifice, and                         rectum were photographed. Findings:      A 3 mm polyp was found in the cecum. The polyp was sessile. The polyp       was removed with a cold snare. Resection and retrieval were complete.       Estimated blood loss: none.      Multiple large-mouthed and medium-mouthed diverticula were found in the       sigmoid colon.      Internal hemorrhoids were found. The hemorrhoids were Grade I (internal       hemorrhoids that do not prolapse).      The terminal ileum appeared normal. Impression:            - One 3 mm polyp in the cecum, removed with a cold                         snare. Resected and retrieved.                        - Diverticulosis in the sigmoid colon.                        - Internal hemorrhoids.                        - The examined portion of the ileum was normal. Recommendation:        - Patient has a contact number available for                         emergencies. The signs and symptoms of potential                         delayed complications were discussed with the patient.                         Return to normal activities tomorrow. Written                         discharge instructions were provided to the patient.                        - High fiber diet.                        - Continue present medications.                        -  Await pathology results.                        - Repeat colonoscopy in 5-10 years for surveillance                         based on pathology results.                        - The findings and recommendations were discussed with                         the designated responsible adult. Procedure Code(s):     --- Professional ---                        [phone], Colonoscopy, flexible; with removal of                         tumor(s), polyp(s), or other  lesion(s) by snare                         technique Diagnosis Code(s):     --- Professional ---                        Z12.11, Encounter for screening for malignant neoplasm                         of colon                        D12.0, Benign neoplasm of cecum                        K64.0, First degree hemorrhoids                        K57.30, Diverticulosis of large intestine without                         perforation or abscess without bleeding CPT copyright 2022 American Medical Association. All rights reserved. The codes documented in this report are preliminary and upon coder review may  be revised to meet current compliance requirements. Clotilda Schaffer, MD 04/21/2024 10:37:55 AM Number of Addenda: 0 Note Initiated On: 04/21/2024 9:51 AM Scope Withdrawal Time: 0 hours 8 minutes 41 seconds  Total Procedure Duration: 0 hours 10 minutes 23 seconds  Estimated Blood Loss:  Estimated blood loss: none.      [facility]

## 2024-04-21 NOTE — Anesthesia Postprocedure Evaluation (Signed)
"   Anesthesia Post Note  Patient: Tomy Khim Elizardo  Procedure(s) Performed: COLONOSCOPY (Rectum) EGD (ESOPHAGOGASTRODUODENOSCOPY) (Mouth) POLYPECTOMY, INTESTINE (Rectum)  Patient location during evaluation: PACU Anesthesia Type: General Level of consciousness: awake and alert Pain management: pain level controlled Vital Signs Assessment: post-procedure vital signs reviewed and stable Respiratory status: spontaneous breathing, nonlabored ventilation, respiratory function stable and patient connected to nasal cannula oxygen Cardiovascular status: blood pressure returned to baseline and stable Postop Assessment: no apparent nausea or vomiting Anesthetic complications: no   No notable events documented.   Last Vitals:  Vitals:   04/21/24 1045 04/21/24 1049  BP: 109/81 107/83  Pulse: 83 77  Resp: 13 13  Temp:  36.7 C  SpO2: 93% 94%    Last Pain:  Vitals:   04/21/24 1049  TempSrc:   PainSc: 0-No pain                 Olliver Boyadjian C Montay Vanvoorhis      "

## 2024-04-28 ENCOUNTER — Telehealth: Payer: Self-pay | Admitting: Family Medicine

## 2024-04-28 NOTE — Telephone Encounter (Signed)
 The patient contacted the office to reschedule his appointment, reporting that his results have not yet been received and that he prefers to wait until results are available. The appointment was rescheduled for 06/12/2024 at 2:30 PM.

## 2024-04-30 ENCOUNTER — Ambulatory Visit: Admitting: Family Medicine

## 2024-05-05 LAB — SURGICAL PATHOLOGY

## 2024-05-06 ENCOUNTER — Ambulatory Visit: Payer: Self-pay | Admitting: Gastroenterology

## 2024-06-12 ENCOUNTER — Ambulatory Visit: Admitting: Family Medicine

## 2024-06-16 ENCOUNTER — Ambulatory Visit: Admitting: Physician Assistant

## 2024-12-15 ENCOUNTER — Encounter: Admitting: Physician Assistant
